# Patient Record
Sex: Female | Born: 1950
Health system: Southern US, Community
[De-identification: ages and names within clinical notes are randomized; demographics above are authoritative.]

## PROBLEM LIST (undated history)

## (undated) DIAGNOSIS — E7800 Pure hypercholesterolemia, unspecified: Secondary | ICD-10-CM

## (undated) DIAGNOSIS — E079 Disorder of thyroid, unspecified: Secondary | ICD-10-CM

## (undated) DIAGNOSIS — E119 Type 2 diabetes mellitus without complications: Secondary | ICD-10-CM

---

## 2013-08-20 ENCOUNTER — Encounter (HOSPITAL_BASED_OUTPATIENT_CLINIC_OR_DEPARTMENT_OTHER): Payer: Self-pay | Admitting: Emergency Medicine

## 2013-08-20 ENCOUNTER — Emergency Department (HOSPITAL_BASED_OUTPATIENT_CLINIC_OR_DEPARTMENT_OTHER): Payer: BC Managed Care – PPO

## 2013-08-20 ENCOUNTER — Emergency Department (HOSPITAL_BASED_OUTPATIENT_CLINIC_OR_DEPARTMENT_OTHER)
Admission: EM | Admit: 2013-08-20 | Discharge: 2013-08-20 | Disposition: A | Discharge status: Home / Self Care | Payer: BC Managed Care – PPO | Attending: Emergency Medicine | Admitting: Emergency Medicine

## 2013-08-20 DIAGNOSIS — E7800 Pure hypercholesterolemia, unspecified: Secondary | ICD-10-CM | POA: Insufficient documentation

## 2013-08-20 DIAGNOSIS — E079 Disorder of thyroid, unspecified: Secondary | ICD-10-CM | POA: Insufficient documentation

## 2013-08-20 DIAGNOSIS — E119 Type 2 diabetes mellitus without complications: Secondary | ICD-10-CM | POA: Insufficient documentation

## 2013-08-20 DIAGNOSIS — H538 Other visual disturbances: Secondary | ICD-10-CM | POA: Insufficient documentation

## 2013-08-20 DIAGNOSIS — Z79899 Other long term (current) drug therapy: Secondary | ICD-10-CM | POA: Insufficient documentation

## 2013-08-20 DIAGNOSIS — R0602 Shortness of breath: Secondary | ICD-10-CM | POA: Insufficient documentation

## 2013-08-20 DIAGNOSIS — R739 Hyperglycemia, unspecified: Secondary | ICD-10-CM

## 2013-08-20 HISTORY — DX: Pure hypercholesterolemia, unspecified: E78.00

## 2013-08-20 HISTORY — DX: Disorder of thyroid, unspecified: E07.9

## 2013-08-20 HISTORY — DX: Type 2 diabetes mellitus without complications: E11.9

## 2013-08-20 LAB — CBG MONITORING, ED: Glucose-Capillary: 380 mg/dL — ABNORMAL HIGH (ref 70–99)

## 2013-08-20 LAB — CBC WITH DIFFERENTIAL/PLATELET
Basophils Absolute: 0 10*3/uL (ref 0.0–0.1)
Basophils Relative: 0 % (ref 0–1)
Eosinophils Absolute: 0.1 10*3/uL (ref 0.0–0.7)
Eosinophils Relative: 1 % (ref 0–5)
HCT: 37.4 % (ref 36.0–46.0)
Hemoglobin: 12.5 g/dL (ref 12.0–15.0)
Lymphocytes Relative: 36 % (ref 12–46)
Lymphs Abs: 1.8 10*3/uL (ref 0.7–4.0)
MCH: 28.7 pg (ref 26.0–34.0)
MCHC: 33.4 g/dL (ref 30.0–36.0)
MCV: 85.8 fL (ref 78.0–100.0)
Monocytes Absolute: 0.4 10*3/uL (ref 0.1–1.0)
Monocytes Relative: 8 % (ref 3–12)
Neutro Abs: 2.7 10*3/uL (ref 1.7–7.7)
Neutrophils Relative %: 55 % (ref 43–77)
Platelets: 280 10*3/uL (ref 150–400)
RBC: 4.36 MIL/uL (ref 3.87–5.11)
RDW: 12.6 % (ref 11.5–15.5)
WBC: 4.9 10*3/uL (ref 4.0–10.5)

## 2013-08-20 LAB — I-STAT VENOUS BLOOD GAS, ED
Acid-Base Excess: 3 mmol/L — ABNORMAL HIGH (ref 0.0–2.0)
Bicarbonate: 29.8 mEq/L — ABNORMAL HIGH (ref 20.0–24.0)
O2 Saturation: 23 %
Patient temperature: 98
TCO2: 31 mmol/L (ref 0–100)
pCO2, Ven: 52.9 mmHg — ABNORMAL HIGH (ref 45.0–50.0)
pH, Ven: 7.356 — ABNORMAL HIGH (ref 7.250–7.300)
pO2, Ven: 17 mmHg — CL (ref 30.0–45.0)

## 2013-08-20 LAB — COMPREHENSIVE METABOLIC PANEL
ALT: 12 U/L (ref 0–35)
AST: 14 U/L (ref 0–37)
Albumin: 4.1 g/dL (ref 3.5–5.2)
Alkaline Phosphatase: 110 U/L (ref 39–117)
BUN: 9 mg/dL (ref 6–23)
CO2: 25 mEq/L (ref 19–32)
Calcium: 10.3 mg/dL (ref 8.4–10.5)
Chloride: 96 mEq/L (ref 96–112)
Creatinine, Ser: 0.7 mg/dL (ref 0.50–1.10)
GFR calc Af Amer: >90 mL/min (ref >90)
GFR calc non Af Amer: >90 mL/min (ref >90)
Glucose, Bld: 356 mg/dL — ABNORMAL HIGH (ref 70–99)
Potassium: 3.8 mEq/L (ref 3.7–5.3)
Sodium: 138 mEq/L (ref 137–147)
Total Bilirubin: 0.3 mg/dL (ref 0.3–1.2)
Total Protein: 7.8 g/dL (ref 6.0–8.3)

## 2013-08-20 LAB — URINALYSIS, ROUTINE W REFLEX MICROSCOPIC
Bilirubin Urine: NEGATIVE
Glucose, UA: >1000 mg/dL — AB
Ketones, ur: 15 mg/dL — AB
Leukocytes, UA: NEGATIVE
Nitrite: NEGATIVE
Protein, ur: NEGATIVE mg/dL
Specific Gravity, Urine: 1.044 — ABNORMAL HIGH (ref 1.005–1.030)
Urobilinogen, UA: 0.2 mg/dL (ref 0.0–1.0)
pH: 5.5 (ref 5.0–8.0)

## 2013-08-20 LAB — KETONES, QUALITATIVE: Acetone, Bld: NEGATIVE

## 2013-08-20 LAB — BASIC METABOLIC PANEL
BUN: 8 mg/dL (ref 6–23)
CO2: 26 mEq/L (ref 19–32)
Calcium: 9.2 mg/dL (ref 8.4–10.5)
Chloride: 100 mEq/L (ref 96–112)
Creatinine, Ser: 0.7 mg/dL (ref 0.50–1.10)
GFR calc Af Amer: >90 mL/min (ref >90)
GFR calc non Af Amer: >90 mL/min (ref >90)
Glucose, Bld: 295 mg/dL — ABNORMAL HIGH (ref 70–99)
Potassium: 3.7 mEq/L (ref 3.7–5.3)
Sodium: 139 mEq/L (ref 137–147)

## 2013-08-20 LAB — URINE MICROSCOPIC-ADD ON

## 2013-08-20 LAB — TROPONIN I: Troponin I: <0.3 ng/mL (ref <0.30)

## 2013-08-20 LAB — I-STAT CG4 LACTIC ACID, ED: Lactic Acid, Venous: 2.1 mmol/L (ref 0.5–2.2)

## 2013-08-20 MED ORDER — SODIUM CHLORIDE 0.9 % IV BOLUS (SEPSIS)
1000.0000 mL | Freq: Once | INTRAVENOUS | Status: AC
  Administered 2013-08-20: 1000 mL via INTRAVENOUS

## 2013-08-20 MED ORDER — INSULIN ASPART 100 UNIT/ML ~~LOC~~ SOLN
5.0000 [IU] | Freq: Once | SUBCUTANEOUS | Status: AC
  Administered 2013-08-20: 5 [IU] via SUBCUTANEOUS
  Filled 2013-08-20: qty 1

## 2013-08-20 MED ORDER — METFORMIN HCL 500 MG PO TABS: 500.0000 mg | ORAL_TABLET | Freq: Two times a day (BID) | ORAL | Status: DC

## 2013-08-20 NOTE — ED Provider Notes (Signed)
CSN: [id number]     Arrival date & time 08/20/13  1738 History  This chart was scribed for Ezequiel Essex, MD by Rolanda Lundborg, ED Scribe. This patient was seen in room MH02/MH02 and the patient's care was started at 7:02 PM.    Chief Complaint  Patient presents with  . Hyperglycemia   The history is provided by the patient. No language interpreter was used.   HPI Comments: Margaret Long is a 63 y.o. female who presents to the Emergency Department complaining of hyperglycemia and increasingly blurry vision. Pt reports her home sugars have been running over 300 for the past 2 weeks. She reports one reading last week was 593. She states before 2 weeks ago her sugars were running below 200. She states she called her PCP today and was told to come here. She stopped taking her insulin and metformin one year ago after losing weight and her DM went away. She denies h/o heart problems and lung problems. She reports intermittent throbbing pain in her left breast that last a few seconds and goes away. She reports she has been getting SOB when walking. She denies CP when walking around. She has been eating and drinking well. She denies nausea, vomiting, diarrhea, fever, cough, hematuria, dysuria, back pain, dizziness, or lightheadedness.    Past Medical History  Diagnosis Date  . Diabetes mellitus without complication   . Thyroid disease   . High cholesterol    History reviewed. No pertinent past surgical history. No family history on file. History  Substance Use Topics  . Smoking status: Never Smoker   . Smokeless tobacco: Not on file  . Alcohol Use: No   OB History   Grav Para Term Preterm Abortions TAB SAB Ect Mult Living                 Review of Systems  Constitutional: Negative for fever.  Eyes: Positive for visual disturbance.  Respiratory: Negative for cough.   Gastrointestinal: Negative for nausea, vomiting, abdominal pain and diarrhea.  Genitourinary: Negative for dysuria and  hematuria.  Musculoskeletal: Negative for back pain.  Neurological: Negative for dizziness and light-headedness.   A complete 10 system review of systems was obtained and all systems are negative except as noted in the HPI and PMH.     Allergies  Review of patient's allergies indicates no known allergies.  Home Medications   Current Outpatient Rx  Name  Route  Sig  Dispense  Refill  . Levothyroxine Sodium (SYNTHROID PO)   Oral   Take by mouth.         . pravastatin (PRAVACHOL) 20 MG tablet   Oral   Take 20 mg by mouth daily.         . metFORMIN (GLUCOPHAGE) 500 MG tablet   Oral   Take 1 tablet (500 mg total) by mouth 2 (two) times daily with a meal.   60 tablet   0    BP 183/91  Pulse 86  Temp(Src) 98 F (36.7 C) (Oral)  Resp 20  Ht 5' 6"$  (1.676 m)  Wt 155 lb (70.308 kg)  BMI 25.03 kg/m2  SpO2 99% Physical Exam  Nursing note and vitals reviewed. Constitutional: She is oriented to person, place, and time. She appears well-developed and well-nourished. No distress.  HENT:  Head: Normocephalic and atraumatic.  Mouth/Throat: Oropharynx is clear and moist. No oropharyngeal exudate.  Eyes: Conjunctivae and EOM are normal. Pupils are equal, round, and reactive to light.  Neck: Normal  range of motion. Neck supple. No tracheal deviation present.  Cardiovascular: Normal rate.   Pulmonary/Chest: Effort normal. No respiratory distress.  Abdominal: Soft. There is no tenderness. There is no rebound and no guarding.  Musculoskeletal: Normal range of motion. She exhibits no edema and no tenderness.  Neurological: She is alert and oriented to person, place, and time. No cranial nerve deficit. She exhibits normal muscle tone. Coordination normal.  Skin: Skin is warm and dry.  Psychiatric: She has a normal mood and affect. Her behavior is normal.    ED Course  Procedures (including critical care time) Medications  sodium chloride 0.9 % bolus 1,000 mL (0 mLs Intravenous  Stopped 08/20/13 2120)  insulin aspart (novoLOG) injection 5 Units (5 Units Subcutaneous Given 08/20/13 2035)  sodium chloride 0.9 % bolus 1,000 mL (0 mLs Intravenous Stopped 08/20/13 2236)    DIAGNOSTIC STUDIES: Oxygen Saturation is 99% on RA, normal by my interpretation.    COORDINATION OF CARE: 7:19 PM- Discussed treatment plan with pt. Pt agrees to plan.    Labs Review Labs Reviewed  URINALYSIS, ROUTINE W REFLEX MICROSCOPIC - Abnormal; Notable for the following:    Specific Gravity, Urine 1.044 (*)    Glucose, UA >1000 (*)    Hgb urine dipstick TRACE (*)    Ketones, ur 15 (*)    All other components within normal limits  URINE MICROSCOPIC-ADD ON - Abnormal; Notable for the following:    Squamous Epithelial / LPF FEW (*)    Bacteria, UA FEW (*)    All other components within normal limits  COMPREHENSIVE METABOLIC PANEL - Abnormal; Notable for the following:    Glucose, Bld 356 (*)    All other components within normal limits  BASIC METABOLIC PANEL - Abnormal; Notable for the following:    Glucose, Bld 295 (*)    All other components within normal limits  CBG MONITORING, ED - Abnormal; Notable for the following:    Glucose-Capillary 380 (*)    All other components within normal limits  I-STAT VENOUS BLOOD GAS, ED - Abnormal; Notable for the following:    pH, Ven 7.356 (*)    pCO2, Ven 52.9 (*)    pO2, Ven 17.0 (*)    Bicarbonate 29.8 (*)    Acid-Base Excess 3.0 (*)    All other components within normal limits  CBC WITH DIFFERENTIAL  KETONES, QUALITATIVE  TROPONIN I  I-STAT CG4 LACTIC ACID, ED   Imaging Review Dg Chest 2 View  08/20/2013   CLINICAL DATA:  Weakness and shortness of breath; hyperglycemia  EXAM: CHEST  2 VIEW  COMPARISON:  None.  FINDINGS: Lungs are clear. Heart size and pulmonary vascularity are normal. No adenopathy. No bone lesions.  IMPRESSION: No abnormality noted.   Electronically Signed   By: Lowella Grip M.D.   On: 08/20/2013 20:16     EKG  Interpretation   Date/Time:  Monday August 20 2013 17:56:34 EDT Ventricular Rate:  79 PR Interval:  158 QRS Duration: 82 QT Interval:  392 QTC Calculation: 449 R Axis:   45 Text Interpretation:  Normal sinus rhythm Normal ECG No previous ECGs  available Confirmed by Mataeo Ingwersen  MD, Talma Aguillard ([id number]) on 08/20/2013 6:05:21  PM      MDM   Final diagnoses:  Hyperglycemia   Elevated blood sugar for the past 2 weeks associated blurry vision. No longer on metformin but has been in the past. Referred to ED by Dr. Today.  Patient is nontoxic-appearing. Vital stable.  Blood sugar 356. Anion gap 17. Serum ketones negative. Small ketones in urine.  patient given aggressive IV hydration and subcutaneous insulin.  Blood sugar improved to 295. Anion gap 13. No evidence of DKA. Patient advised to restart metformin and followup with PCP this week. Advised to keep log of blood sugars. Metformin restarted at 500 mg twice daily. Patient instructed to keep blood sugar log and followup with PCP this week.  I personally performed the services described in this documentation, which was scribed in my presence. The recorded information has been reviewed and is accurate.   Ezequiel Essex, MD 08/21/13 [phone]

## 2013-08-20 NOTE — Discharge Instructions (Signed)
Hyperglycemia Followup with your Dr. this week. Keep a log of your blood sugars. Return to the ED if you develop new or worsening symptoms. Hyperglycemia occurs when the glucose (sugar) in your blood is too high. Hyperglycemia can happen for many reasons, but it most often happens to people who do not know they have diabetes or are not managing their diabetes properly.  CAUSES  Whether you have diabetes or not, there are other causes of hyperglycemia. Hyperglycemia can occur when you have diabetes, but it can also occur in other situations that you might not be as aware of, such as: Diabetes  If you have diabetes and are having problems controlling your blood glucose, hyperglycemia could occur because of some of the following reasons:  Not following your meal plan.  Not taking your diabetes medications or not taking it properly.  Exercising less or doing less activity than you normally do.  Being sick. Pre-diabetes  This cannot be ignored. Before people develop Type 2 diabetes, they almost always have "pre-diabetes." This is when your blood glucose levels are higher than normal, but not yet high enough to be diagnosed as diabetes. Research has shown that some long-term damage to the body, especially the heart and circulatory system, may already be occurring during pre-diabetes. If you take action to manage your blood glucose when you have pre-diabetes, you may delay or prevent Type 2 diabetes from developing. Stress  If you have diabetes, you may be "diet" controlled or on oral medications or insulin to control your diabetes. However, you may find that your blood glucose is higher than usual in the hospital whether you have diabetes or not. This is often referred to as "stress hyperglycemia." Stress can elevate your blood glucose. This happens because of hormones put out by the body during times of stress. If stress has been the cause of your high blood glucose, it can be followed regularly by  your caregiver. That way he/she can make sure your hyperglycemia does not continue to get worse or progress to diabetes. Steroids  Steroids are medications that act on the infection fighting system (immune system) to block inflammation or infection. One side effect can be a rise in blood glucose. Most people can produce enough extra insulin to allow for this rise, but for those who cannot, steroids make blood glucose levels go even higher. It is not unusual for steroid treatments to "uncover" diabetes that is developing. It is not always possible to determine if the hyperglycemia will go away after the steroids are stopped. A special blood test called an A1c is sometimes done to determine if your blood glucose was elevated before the steroids were started. SYMPTOMS  Thirsty.  Frequent urination.  Dry mouth.  Blurred vision.  Tired or fatigue.  Weakness.  Sleepy.  Tingling in feet or leg. DIAGNOSIS  Diagnosis is made by monitoring blood glucose in one or all of the following ways:  A1c test. This is a chemical found in your blood.  Fingerstick blood glucose monitoring.  Laboratory results. TREATMENT  First, knowing the cause of the hyperglycemia is important before the hyperglycemia can be treated. Treatment may include, but is not be limited to:  Education.  Change or adjustment in medications.  Change or adjustment in meal plan.  Treatment for an illness, infection, etc.  More frequent blood glucose monitoring.  Change in exercise plan.  Decreasing or stopping steroids.  Lifestyle changes. HOME CARE INSTRUCTIONS   Test your blood glucose as directed.  Exercise  regularly. Your caregiver will give you instructions about exercise. Pre-diabetes or diabetes which comes on with stress is helped by exercising.  Eat wholesome, balanced meals. Eat often and at regular, fixed times. Your caregiver or nutritionist will give you a meal plan to guide your sugar  intake.  Being at an ideal weight is important. If needed, losing as little as 10 to 15 pounds may help improve blood glucose levels. SEEK MEDICAL CARE IF:   You have questions about medicine, activity, or diet.  You continue to have symptoms (problems such as increased thirst, urination, or weight gain). SEEK IMMEDIATE MEDICAL CARE IF:   You are vomiting or have diarrhea.  Your breath smells fruity.  You are breathing faster or slower.  You are very sleepy or incoherent.  You have numbness, tingling, or pain in your feet or hands.  You have chest pain.  Your symptoms get worse even though you have been following your caregiver's orders.  If you have any other questions or concerns. Document Released: 11/03/2000 Document Revised: 08/02/2011 Document Reviewed: 09/06/2011 [facility] Patient Information 2014 [place], [place].

## 2013-08-20 NOTE — ED Notes (Signed)
Pt reports that she has had elevated sugars over the past 2 weeks.  Pt reports increased stress and eating fast food more.  Denies N/V/D.  Reports that she was taken off metformin recently because her diabetes was under control.  Also reports intermittent blurred vision.  Pt had an eye exam last month and was normal.

## 2013-08-20 NOTE — ED Notes (Signed)
High blood sugar. accu checks at home have been around 300 for the past 2 weeks. Sob, blurred vision.

## 2014-07-29 DIAGNOSIS — E119 Type 2 diabetes mellitus without complications: Secondary | ICD-10-CM

## 2014-07-29 DIAGNOSIS — E7800 Pure hypercholesterolemia, unspecified: Secondary | ICD-10-CM | POA: Insufficient documentation

## 2014-07-29 DIAGNOSIS — E890 Postprocedural hypothyroidism: Secondary | ICD-10-CM | POA: Insufficient documentation

## 2014-07-29 HISTORY — DX: Postprocedural hypothyroidism: E89.0

## 2014-07-29 HISTORY — DX: Pure hypercholesterolemia, unspecified: E78.00

## 2014-07-29 HISTORY — DX: Type 2 diabetes mellitus without complications: E11.9

## 2014-07-30 DIAGNOSIS — G4700 Insomnia, unspecified: Secondary | ICD-10-CM | POA: Insufficient documentation

## 2014-07-30 DIAGNOSIS — F329 Major depressive disorder, single episode, unspecified: Secondary | ICD-10-CM

## 2014-07-30 DIAGNOSIS — E559 Vitamin D deficiency, unspecified: Secondary | ICD-10-CM | POA: Insufficient documentation

## 2014-07-30 HISTORY — DX: Vitamin D deficiency, unspecified: E55.9

## 2014-07-30 HISTORY — DX: Major depressive disorder, single episode, unspecified: F32.9

## 2014-07-30 HISTORY — DX: Insomnia, unspecified: G47.00

## 2015-06-10 DIAGNOSIS — I10 Essential (primary) hypertension: Secondary | ICD-10-CM

## 2015-06-10 HISTORY — DX: Essential (primary) hypertension: I10

## 2015-07-12 ENCOUNTER — Emergency Department (HOSPITAL_BASED_OUTPATIENT_CLINIC_OR_DEPARTMENT_OTHER)
Admission: EM | Admit: 2015-07-12 | Discharge: 2015-07-12 | Disposition: A | Discharge status: Home / Self Care | Payer: BLUE CROSS/BLUE SHIELD | Attending: Emergency Medicine | Admitting: Emergency Medicine

## 2015-07-12 DIAGNOSIS — Z7984 Long term (current) use of oral hypoglycemic drugs: Secondary | ICD-10-CM | POA: Diagnosis not present

## 2015-07-12 DIAGNOSIS — M542 Cervicalgia: Secondary | ICD-10-CM | POA: Diagnosis not present

## 2015-07-12 DIAGNOSIS — E7800 Pure hypercholesterolemia, unspecified: Secondary | ICD-10-CM | POA: Insufficient documentation

## 2015-07-12 DIAGNOSIS — J312 Chronic pharyngitis: Secondary | ICD-10-CM

## 2015-07-12 DIAGNOSIS — Z79899 Other long term (current) drug therapy: Secondary | ICD-10-CM | POA: Insufficient documentation

## 2015-07-12 DIAGNOSIS — E079 Disorder of thyroid, unspecified: Secondary | ICD-10-CM | POA: Diagnosis not present

## 2015-07-12 DIAGNOSIS — H538 Other visual disturbances: Secondary | ICD-10-CM | POA: Diagnosis not present

## 2015-07-12 DIAGNOSIS — E119 Type 2 diabetes mellitus without complications: Secondary | ICD-10-CM | POA: Insufficient documentation

## 2015-07-12 DIAGNOSIS — J029 Acute pharyngitis, unspecified: Secondary | ICD-10-CM | POA: Diagnosis not present

## 2015-07-12 LAB — RAPID STREP SCREEN (MED CTR MEBANE ONLY): Streptococcus, Group A Screen (Direct): NEGATIVE

## 2015-07-12 NOTE — ED Provider Notes (Signed)
CSN: [id number]     Arrival date & time 07/12/15  0708 History   First MD Initiated Contact with Patient 07/12/15 0719     Chief Complaint  Patient presents with  . Sore Throat     (Consider location/radiation/quality/duration/timing/severity/associated sxs/prior Treatment) Patient is a 65 y.o. female presenting with pharyngitis. The history is provided by the patient.  Sore Throat Pertinent negatives include no chest pain, no abdominal pain, no headaches and no shortness of breath.   Patient with complaint of sore throat since December. Evaluate by her primary care doctor at that time with an adjustment in one of her medications without any improvement. Patient states has been constant since that time increasing some of late associated with mild difficulty with swallowing. No trouble breathing. Burning sensation. No known history of reflux currently but she's had it in the past. Patient does have diabetes. As well as patient does have a history of thyroid disease. Patient does not feel like she has an acute upper respiratory infection. No fevers. No change in voice. Past Medical History  Diagnosis Date  . Diabetes mellitus without complication   . Thyroid disease   . High cholesterol    No past surgical history on file. No family history on file. Social History  Substance Use Topics  . Smoking status: Never Smoker   . Smokeless tobacco: Not on file  . Alcohol Use: No   OB History    No data available     Review of Systems  Constitutional: Negative for fever.  HENT: Positive for sore throat and trouble swallowing. Negative for congestion.   Eyes: Positive for visual disturbance. Negative for pain.  Respiratory: Negative for shortness of breath.   Cardiovascular: Negative for chest pain.  Gastrointestinal: Negative for nausea, vomiting and abdominal pain.  Genitourinary: Negative for dysuria.  Musculoskeletal: Positive for neck pain. Negative for neck stiffness.  Skin:  Negative for rash.  Neurological: Negative for speech difficulty and headaches.  Hematological: Does not bruise/bleed easily.  Psychiatric/Behavioral: Negative for confusion.      Allergies  Review of patient's allergies indicates no known allergies.  Home Medications   Prior to Admission medications   Medication Sig Start Date End Date Taking? Authorizing Provider  Levothyroxine Sodium (SYNTHROID PO) Take by mouth.   Yes Historical Provider, MD  metFORMIN (GLUCOPHAGE) 500 MG tablet Take 1 tablet (500 mg total) by mouth 2 (two) times daily with a meal. 08/20/13  Yes Ezequiel Essex, MD  pravastatin (PRAVACHOL) 20 MG tablet Take 20 mg by mouth daily.    Historical Provider, MD   BP 164/98 mmHg  Pulse 86  Temp(Src) 98.1 F (36.7 C) (Oral)  Resp 18  Ht 5\' 6"  (1.676 m)  Wt 68.493 kg  BMI 24.38 kg/m2  SpO2 99% Physical Exam  Constitutional: She is oriented to person, place, and time. She appears well-developed and well-nourished. No distress.  HENT:  Head: Normocephalic and atraumatic.  Mouth/Throat: Oropharynx is clear and moist. No oropharyngeal exudate.  Eyes: Conjunctivae and EOM are normal. Pupils are equal, round, and reactive to light.  Neck: Normal range of motion. Neck supple.  Cardiovascular: Normal rate, regular rhythm and normal heart sounds.   Pulmonary/Chest: Effort normal and breath sounds normal. No respiratory distress.  Abdominal: Soft. Bowel sounds are normal. She exhibits no distension.  Musculoskeletal: Normal range of motion.  Neurological: She is alert and oriented to person, place, and time. No cranial nerve deficit. She exhibits normal muscle tone. Coordination normal.  Skin:  Skin is warm. No rash noted.  Nursing note and vitals reviewed.   ED Course  Procedures (including critical care time) Labs Review Labs Reviewed  RAPID STREP SCREEN (NOT AT [facility])  CULTURE, GROUP A STREP [facility])    Imaging Review No results found. I have personally reviewed  and evaluated these images and lab results as part of my medical decision-making.   EKG Interpretation None      MDM   Final diagnoses:  Sore throat, chronic   Patient with complaint of sore throat the constantly since December. Patient states that it's seems to be in the lower part of the throat not the back of throat. Patient's voice is not hoarse. Examination of the thyroid area seems to be normal. The pharynx appeared normal. Recommend follow-up with ear nose and throat for fiber-optic evaluation. Patient's rapid strep was negative as expected. Throat culture is pending.    Fredia Sorrow, MD 07/12/15 [phone]

## 2015-07-12 NOTE — ED Notes (Signed)
States has had a sore throat, progressively worse since Dec, this week discomfort is more

## 2015-07-12 NOTE — Discharge Instructions (Signed)
Rapid strep was negative. Formal culture pending. As we discussed we expected it to be negative. Important that you follow up with ear nose and throat for additional evaluation.

## 2015-07-12 NOTE — ED Notes (Signed)
States has had a sore throat since Dec, has progressively become worse since then, now having some difficulty swallowing. Speech WNL. Pain at times feels like it is burning

## 2015-07-15 LAB — CULTURE, GROUP A STREP (THRC)

## 2015-08-25 DIAGNOSIS — J309 Allergic rhinitis, unspecified: Secondary | ICD-10-CM | POA: Insufficient documentation

## 2015-08-25 DIAGNOSIS — M8580 Other specified disorders of bone density and structure, unspecified site: Secondary | ICD-10-CM

## 2015-08-25 DIAGNOSIS — K219 Gastro-esophageal reflux disease without esophagitis: Secondary | ICD-10-CM | POA: Insufficient documentation

## 2015-08-25 DIAGNOSIS — G5600 Carpal tunnel syndrome, unspecified upper limb: Secondary | ICD-10-CM

## 2015-08-25 DIAGNOSIS — M810 Age-related osteoporosis without current pathological fracture: Secondary | ICD-10-CM

## 2015-08-25 DIAGNOSIS — E785 Hyperlipidemia, unspecified: Secondary | ICD-10-CM | POA: Insufficient documentation

## 2015-08-25 HISTORY — DX: Allergic rhinitis, unspecified: J30.9

## 2015-08-25 HISTORY — DX: Carpal tunnel syndrome, unspecified upper limb: G56.00

## 2015-08-25 HISTORY — DX: Hyperlipidemia, unspecified: E78.5

## 2015-08-25 HISTORY — DX: Other specified disorders of bone density and structure, unspecified site: M85.80

## 2015-08-25 HISTORY — DX: Gastro-esophageal reflux disease without esophagitis: K21.9

## 2015-08-25 HISTORY — DX: Age-related osteoporosis without current pathological fracture: M81.0

## 2017-03-02 DIAGNOSIS — E559 Vitamin D deficiency, unspecified: Secondary | ICD-10-CM | POA: Diagnosis not present

## 2017-03-17 DIAGNOSIS — H524 Presbyopia: Secondary | ICD-10-CM | POA: Diagnosis not present

## 2017-04-28 ENCOUNTER — Other Ambulatory Visit: Payer: Self-pay | Admitting: *Deleted

## 2017-04-28 NOTE — Patient Outreach (Signed)
[facility]) Care Management  04/28/2017  Margaret Long [birth year] [id number]   Health Risk Assessment call  Successful outreach call to patient , explained purpose of call. Patient discussed her medical conditions of  Diabetes : monitoring blood sugars daily, taking metformin as prescribed,recent A1c less than 78, patient discussed regular follow up visits to with PCP. Hypertension : taking medications as prescribed,  Reports blood pressure readings have been controlled at office visits, she has  plans to purchase monitor to begin checking at home, she reports being a nonsmoker.   Patient denies question,concerns or follow up needs. Patient verifies her PCP is  [employer]NP in [facility].    Will send follow-up letter with [facility] care management contact information.   Joylene Draft, RN, [place] Management Coordinator  [phone]- Mobile [phone]- Toll Free Main Office

## 2017-04-29 ENCOUNTER — Encounter: Payer: Self-pay | Admitting: *Deleted

## 2017-05-27 DIAGNOSIS — E119 Type 2 diabetes mellitus without complications: Secondary | ICD-10-CM | POA: Diagnosis not present

## 2017-05-27 DIAGNOSIS — Z1211 Encounter for screening for malignant neoplasm of colon: Secondary | ICD-10-CM | POA: Diagnosis not present

## 2017-05-27 DIAGNOSIS — E559 Vitamin D deficiency, unspecified: Secondary | ICD-10-CM | POA: Diagnosis not present

## 2017-05-27 DIAGNOSIS — E890 Postprocedural hypothyroidism: Secondary | ICD-10-CM | POA: Diagnosis not present

## 2017-05-27 DIAGNOSIS — I10 Essential (primary) hypertension: Secondary | ICD-10-CM | POA: Diagnosis not present

## 2017-05-27 DIAGNOSIS — E782 Mixed hyperlipidemia: Secondary | ICD-10-CM | POA: Diagnosis not present

## 2017-05-31 DIAGNOSIS — F411 Generalized anxiety disorder: Secondary | ICD-10-CM | POA: Diagnosis not present

## 2017-05-31 DIAGNOSIS — R0602 Shortness of breath: Secondary | ICD-10-CM | POA: Diagnosis not present

## 2017-06-03 DIAGNOSIS — Z780 Asymptomatic menopausal state: Secondary | ICD-10-CM | POA: Diagnosis not present

## 2017-06-03 DIAGNOSIS — M8580 Other specified disorders of bone density and structure, unspecified site: Secondary | ICD-10-CM | POA: Diagnosis not present

## 2017-06-03 DIAGNOSIS — M8588 Other specified disorders of bone density and structure, other site: Secondary | ICD-10-CM | POA: Diagnosis not present

## 2017-06-03 DIAGNOSIS — Z1231 Encounter for screening mammogram for malignant neoplasm of breast: Secondary | ICD-10-CM | POA: Diagnosis not present

## 2017-06-22 DIAGNOSIS — F411 Generalized anxiety disorder: Secondary | ICD-10-CM | POA: Diagnosis not present

## 2017-06-22 DIAGNOSIS — Z01419 Encounter for gynecological examination (general) (routine) without abnormal findings: Secondary | ICD-10-CM | POA: Diagnosis not present

## 2017-06-22 DIAGNOSIS — Z0000 Encounter for general adult medical examination without abnormal findings: Secondary | ICD-10-CM | POA: Diagnosis not present

## 2017-07-25 DIAGNOSIS — D485 Neoplasm of uncertain behavior of skin: Secondary | ICD-10-CM | POA: Diagnosis not present

## 2017-07-25 DIAGNOSIS — L821 Other seborrheic keratosis: Secondary | ICD-10-CM | POA: Diagnosis not present

## 2017-07-25 DIAGNOSIS — D2272 Melanocytic nevi of left lower limb, including hip: Secondary | ICD-10-CM | POA: Diagnosis not present

## 2017-08-19 DIAGNOSIS — E538 Deficiency of other specified B group vitamins: Secondary | ICD-10-CM

## 2017-08-19 DIAGNOSIS — E559 Vitamin D deficiency, unspecified: Secondary | ICD-10-CM | POA: Diagnosis not present

## 2017-08-19 DIAGNOSIS — I10 Essential (primary) hypertension: Secondary | ICD-10-CM | POA: Diagnosis not present

## 2017-08-19 HISTORY — DX: Deficiency of other specified B group vitamins: E53.8

## 2017-09-12 DIAGNOSIS — D2272 Melanocytic nevi of left lower limb, including hip: Secondary | ICD-10-CM | POA: Diagnosis not present

## 2017-09-12 DIAGNOSIS — M71342 Other bursal cyst, left hand: Secondary | ICD-10-CM | POA: Diagnosis not present

## 2017-12-23 DIAGNOSIS — E782 Mixed hyperlipidemia: Secondary | ICD-10-CM | POA: Diagnosis not present

## 2017-12-23 DIAGNOSIS — I10 Essential (primary) hypertension: Secondary | ICD-10-CM | POA: Diagnosis not present

## 2017-12-23 DIAGNOSIS — E559 Vitamin D deficiency, unspecified: Secondary | ICD-10-CM | POA: Diagnosis not present

## 2018-03-22 DIAGNOSIS — E119 Type 2 diabetes mellitus without complications: Secondary | ICD-10-CM | POA: Diagnosis not present

## 2018-03-22 DIAGNOSIS — H2513 Age-related nuclear cataract, bilateral: Secondary | ICD-10-CM | POA: Diagnosis not present

## 2018-03-22 DIAGNOSIS — H43393 Other vitreous opacities, bilateral: Secondary | ICD-10-CM | POA: Diagnosis not present

## 2018-03-22 DIAGNOSIS — H04123 Dry eye syndrome of bilateral lacrimal glands: Secondary | ICD-10-CM | POA: Diagnosis not present

## 2018-06-13 DIAGNOSIS — E782 Mixed hyperlipidemia: Secondary | ICD-10-CM | POA: Diagnosis not present

## 2018-06-13 DIAGNOSIS — Z1239 Encounter for other screening for malignant neoplasm of breast: Secondary | ICD-10-CM | POA: Diagnosis not present

## 2018-06-13 DIAGNOSIS — Z7984 Long term (current) use of oral hypoglycemic drugs: Secondary | ICD-10-CM | POA: Diagnosis not present

## 2018-06-13 DIAGNOSIS — M25551 Pain in right hip: Secondary | ICD-10-CM | POA: Diagnosis not present

## 2018-06-13 DIAGNOSIS — G4700 Insomnia, unspecified: Secondary | ICD-10-CM | POA: Diagnosis not present

## 2018-06-13 DIAGNOSIS — E890 Postprocedural hypothyroidism: Secondary | ICD-10-CM | POA: Diagnosis not present

## 2018-06-13 DIAGNOSIS — I10 Essential (primary) hypertension: Secondary | ICD-10-CM | POA: Diagnosis not present

## 2018-06-13 DIAGNOSIS — E559 Vitamin D deficiency, unspecified: Secondary | ICD-10-CM | POA: Diagnosis not present

## 2018-06-13 DIAGNOSIS — Z87891 Personal history of nicotine dependence: Secondary | ICD-10-CM | POA: Diagnosis not present

## 2018-06-13 DIAGNOSIS — E119 Type 2 diabetes mellitus without complications: Secondary | ICD-10-CM | POA: Diagnosis not present

## 2018-06-19 DIAGNOSIS — M7061 Trochanteric bursitis, right hip: Secondary | ICD-10-CM

## 2018-06-19 DIAGNOSIS — M25551 Pain in right hip: Secondary | ICD-10-CM | POA: Diagnosis not present

## 2018-06-19 HISTORY — DX: Trochanteric bursitis, right hip: M70.61

## 2018-06-23 DIAGNOSIS — Z0000 Encounter for general adult medical examination without abnormal findings: Secondary | ICD-10-CM | POA: Diagnosis not present

## 2018-06-23 DIAGNOSIS — Z1239 Encounter for other screening for malignant neoplasm of breast: Secondary | ICD-10-CM | POA: Diagnosis not present

## 2018-06-23 DIAGNOSIS — Z1231 Encounter for screening mammogram for malignant neoplasm of breast: Secondary | ICD-10-CM | POA: Diagnosis not present

## 2018-06-23 DIAGNOSIS — Z2821 Immunization not carried out because of patient refusal: Secondary | ICD-10-CM | POA: Diagnosis not present

## 2018-07-21 ENCOUNTER — Other Ambulatory Visit: Payer: Self-pay

## 2018-07-21 NOTE — Patient Outreach (Signed)
  [facility]) Care Management Chronic Special Needs Program  07/21/2018  Name: Margaret Long DOB: 05-16-1951  MRN: [id number]  Ms. Margaret Long is enrolled in a chronic special needs plan for  Diabetes. A completed health risk assessment has not been received from the client and client has not responded to outreach attempts by their health care concierge.  The client's individualized care plan was developed based on available data.  Plan:  . Send unsuccessful outreach letter with a copy of individualized care plan to client . Send individualized care plan to provider . Send Advanced Directives packet, HTN education  Chronic care management coordinator will attempt outreach in 2-4 months.    Peter Garter RN, Jackquline Denmark, CDE Chronic Care Management Coordinator [facility] Care Management [phone]

## 2018-10-19 ENCOUNTER — Other Ambulatory Visit: Payer: Self-pay

## 2018-10-19 NOTE — Patient Outreach (Signed)
[facility]) Care Management Chronic Special Needs Program  10/19/2018  Name: Margaret Long DOB: 1951/03/25  MRN: [id number]  Margaret Long is enrolled in a chronic special needs plan for Diabetes. Chronic Care Management Coordinator telephoned client to review health risk assessment and to develop individualized care plan.  Introduced the chronic care management program, importance of client participation, and taking their care plan to all provider appointments and inpatient facilities.  Reviewed the transition of care process and possible referral to community care management.  Subjective: Client states that her blood sugars range from 90-130 in the morning.  States she was walking before the covid shut down but she has not restarted.  States she has the Advanced Directives forms but does not wish to get assistance to complete them.  States her B/P usually runs good.    Goals Addressed            This Visit's Progress   . COMPLETED:  Acknowledge receipt of Advanced Directive package       Received packet    . Advanced Care Planning complete by 9 months   On track   . Client understands the importance of follow-up with providers by attending scheduled visits   On track   . Client will use Assistive Devices as needed and verbalize understanding of device use   On track   . Client will verbalize knowledge of self management of Hypertension as evidences by BP reading of 140/90 or less; or as defined by provider   On track   . HEMOGLOBIN A1C < 7.0       Diabetes self management actions:  Glucose monitoring per provider recommendations  Eat Healthy  Check feet daily  Visit provider every 3-6 months as directed  Hbg A1C level every 3-6 months.  Eye Exam yearly    . Maintain timely refills of diabetic medication as prescribed within the year .   On track   . COMPLETED: Obtain annual  Lipid Profile, LDL-C       Completed 06/13/18     . Obtain Annual Eye  (retinal)  Exam    On track   . COMPLETED: Obtain Annual Foot Exam       Completed 06/13/18    . Obtain annual screen for micro albuminuria (urine) , nephropathy (kidney problems)   On track         . Obtain Hemoglobin A1C at least 2 times per year   On track    Completed 06/13/18     . Visit Primary Care Provider or Endocrinologist at least 2 times per year    On track    Saw provider 06/23/18      Client is meeting diabetes self management goal of hemoglobin A1C of <7% with last reading 6.4% on 06/13/18.  Client received Advanced Directives packet but declines social work referral at this time. Encouraged to follow a low CHO diet and to limit drinking sugar sweeten beverages.  Encouraged to walk outside but does not want to make exercise goal at this time. Discussed COVID19 cause, symptoms, precautions (social distancing, stay at home order, hand washing), confirmed client knows how to contact provider Plan:  Send successful outreach letter with a copy of their individualized care plan, Send individual care plan to provider and Send educational material  Chronic care management coordination will outreach in:  6 Months     Peter Garter RN, [employer], [place] Management [phone])  [phone]  

## 2018-11-03 DIAGNOSIS — Z20828 Contact with and (suspected) exposure to other viral communicable diseases: Secondary | ICD-10-CM | POA: Diagnosis not present

## 2018-12-22 DIAGNOSIS — E119 Type 2 diabetes mellitus without complications: Secondary | ICD-10-CM | POA: Diagnosis not present

## 2018-12-22 DIAGNOSIS — Z794 Long term (current) use of insulin: Secondary | ICD-10-CM | POA: Diagnosis not present

## 2018-12-22 DIAGNOSIS — I10 Essential (primary) hypertension: Secondary | ICD-10-CM | POA: Diagnosis not present

## 2018-12-22 DIAGNOSIS — E559 Vitamin D deficiency, unspecified: Secondary | ICD-10-CM | POA: Diagnosis not present

## 2018-12-22 DIAGNOSIS — E890 Postprocedural hypothyroidism: Secondary | ICD-10-CM | POA: Diagnosis not present

## 2018-12-22 DIAGNOSIS — K219 Gastro-esophageal reflux disease without esophagitis: Secondary | ICD-10-CM | POA: Diagnosis not present

## 2019-01-24 DIAGNOSIS — N6459 Other signs and symptoms in breast: Secondary | ICD-10-CM | POA: Diagnosis not present

## 2019-01-24 DIAGNOSIS — K219 Gastro-esophageal reflux disease without esophagitis: Secondary | ICD-10-CM | POA: Diagnosis not present

## 2019-01-24 DIAGNOSIS — Z6825 Body mass index (BMI) 25.0-25.9, adult: Secondary | ICD-10-CM | POA: Diagnosis not present

## 2019-01-24 DIAGNOSIS — E119 Type 2 diabetes mellitus without complications: Secondary | ICD-10-CM | POA: Diagnosis not present

## 2019-01-24 DIAGNOSIS — K5900 Constipation, unspecified: Secondary | ICD-10-CM | POA: Diagnosis not present

## 2019-01-24 DIAGNOSIS — I10 Essential (primary) hypertension: Secondary | ICD-10-CM | POA: Diagnosis not present

## 2019-01-24 DIAGNOSIS — Z008 Encounter for other general examination: Secondary | ICD-10-CM | POA: Diagnosis not present

## 2019-01-24 DIAGNOSIS — Z0000 Encounter for general adult medical examination without abnormal findings: Secondary | ICD-10-CM | POA: Diagnosis not present

## 2019-01-24 DIAGNOSIS — T148XXA Other injury of unspecified body region, initial encounter: Secondary | ICD-10-CM | POA: Diagnosis not present

## 2019-01-24 DIAGNOSIS — E039 Hypothyroidism, unspecified: Secondary | ICD-10-CM | POA: Diagnosis not present

## 2019-01-24 DIAGNOSIS — R109 Unspecified abdominal pain: Secondary | ICD-10-CM | POA: Diagnosis not present

## 2019-01-24 DIAGNOSIS — E663 Overweight: Secondary | ICD-10-CM | POA: Diagnosis not present

## 2019-02-01 DIAGNOSIS — N281 Cyst of kidney, acquired: Secondary | ICD-10-CM | POA: Diagnosis not present

## 2019-02-01 DIAGNOSIS — K760 Fatty (change of) liver, not elsewhere classified: Secondary | ICD-10-CM | POA: Diagnosis not present

## 2019-02-01 DIAGNOSIS — R109 Unspecified abdominal pain: Secondary | ICD-10-CM | POA: Diagnosis not present

## 2019-02-01 DIAGNOSIS — K5900 Constipation, unspecified: Secondary | ICD-10-CM | POA: Diagnosis not present

## 2019-02-19 DIAGNOSIS — M25559 Pain in unspecified hip: Secondary | ICD-10-CM | POA: Diagnosis not present

## 2019-02-19 DIAGNOSIS — Z23 Encounter for immunization: Secondary | ICD-10-CM | POA: Diagnosis not present

## 2019-02-19 DIAGNOSIS — Z0001 Encounter for general adult medical examination with abnormal findings: Secondary | ICD-10-CM | POA: Diagnosis not present

## 2019-02-19 DIAGNOSIS — E663 Overweight: Secondary | ICD-10-CM | POA: Diagnosis not present

## 2019-02-19 DIAGNOSIS — E039 Hypothyroidism, unspecified: Secondary | ICD-10-CM | POA: Diagnosis not present

## 2019-02-19 DIAGNOSIS — E119 Type 2 diabetes mellitus without complications: Secondary | ICD-10-CM | POA: Diagnosis not present

## 2019-02-19 DIAGNOSIS — N6459 Other signs and symptoms in breast: Secondary | ICD-10-CM | POA: Diagnosis not present

## 2019-02-19 DIAGNOSIS — M25519 Pain in unspecified shoulder: Secondary | ICD-10-CM | POA: Diagnosis not present

## 2019-02-19 DIAGNOSIS — Z7189 Other specified counseling: Secondary | ICD-10-CM | POA: Diagnosis not present

## 2019-02-19 DIAGNOSIS — Z79899 Other long term (current) drug therapy: Secondary | ICD-10-CM | POA: Diagnosis not present

## 2019-02-19 DIAGNOSIS — Z008 Encounter for other general examination: Secondary | ICD-10-CM | POA: Diagnosis not present

## 2019-02-19 DIAGNOSIS — I10 Essential (primary) hypertension: Secondary | ICD-10-CM | POA: Diagnosis not present

## 2019-02-26 DIAGNOSIS — N6489 Other specified disorders of breast: Secondary | ICD-10-CM | POA: Diagnosis not present

## 2019-02-26 DIAGNOSIS — N6459 Other signs and symptoms in breast: Secondary | ICD-10-CM | POA: Diagnosis not present

## 2019-02-26 DIAGNOSIS — N644 Mastodynia: Secondary | ICD-10-CM | POA: Diagnosis not present

## 2019-02-26 DIAGNOSIS — R922 Inconclusive mammogram: Secondary | ICD-10-CM | POA: Diagnosis not present

## 2019-02-26 DIAGNOSIS — R928 Other abnormal and inconclusive findings on diagnostic imaging of breast: Secondary | ICD-10-CM | POA: Diagnosis not present

## 2019-04-03 ENCOUNTER — Ambulatory Visit: Payer: HMO

## 2019-04-12 ENCOUNTER — Other Ambulatory Visit: Payer: Self-pay

## 2019-04-12 NOTE — Patient Outreach (Signed)
  [facility]) Care Management Chronic Special Needs Program  04/12/2019  Name: Guylene Jentzsch DOB: 04-Nov-1950  MRN: [id number]  Ms. Niva Wisby is enrolled in a chronic special needs plan for Diabetes. Client called with no answer No answer and HIPAA compliant message left. 1st attempt Plan for 2nd outreach call in 2-3weeks Chronic care management coordinator will attempt outreach in 2-3 weeks.   Peter Garter RN, Jackquline Denmark, CDE Chronic Care Management Coordinator [place] [facility] Care Management [phone]

## 2019-04-13 ENCOUNTER — Other Ambulatory Visit: Payer: Self-pay

## 2019-04-13 NOTE — Patient Outreach (Signed)
  [facility]) Care Management Chronic Special Needs Program  04/13/2019  Name: Margaret Long DOB: 06-07-1950  MRN: [id number]  Margaret Long is enrolled in a chronic special needs plan for Diabetes. Reviewed and updated care plan. Client returned call from message yesterday 04/12/19  Subjective:Client states she has moved into a new apartment which is on the 2nd floor so she has been getting more exercise and down the stairs.  States she now has a new primary care provider at [facility] in [facility] Dr. Wynn Banker  States she saw her in September and she has an appt on 04/30/19.  States her blood sugars have been good ranging from 100-120 most days.  States she does have a problem watching her portion sizes especially with fruit.  States she has an eye exam scheduled for December.  Goals Addressed            This Visit's Progress   . Advanced Care Planning complete by 9 months(continue 04/13/19   On track   . Client understands the importance of follow-up with providers by attending scheduled visits   On track   . COMPLETED: Client will use Assistive Devices as needed and verbalize understanding of device use      . Client will verbalize knowledge of self management of Hypertension as evidences by BP reading of 140/90 or less; or as defined by provider   On track   . Maintain timely refills of diabetic medication as prescribed within the year .   On track   . Obtain Annual Eye (retinal)  Exam    On track    Keep scheduled eye exam    . COMPLETED: Obtain annual screen for micro albuminuria (urine) , nephropathy (kidney problems)       Completed 06/13/18     . COMPLETED: Obtain Hemoglobin A1C at least 2 times per year       Completed 06/13/18, 12/22/18     . COMPLETED: Visit Primary Care Provider or Endocrinologist at least 2 times per year        Saw provider 06/23/18, 12/22/18      Client is meeting diabetes self management goal of hemoglobin A1C of <7% with last  reading of 6.2% Reinforced to complete her Advanced Directives  Reviewed low CHO, low sodium diet and to watch portion sizes Reviewed portion sizes of fruit and suggested she measure out her grapes in a bowl to help make her more aware of the amount of fruit she is eating Encouraged to continue to be active and to try to increase the number of days she walks Reviewed number for 24-hour [place] 19 precautions  Plan:  Send successful outreach letter with a copy of their individualized care plan and Send individual care plan to provider  Chronic care management coordinator will outreach in:  4-6 Months     Peter Garter RN, [facility], [place] Management Coordinator [place] Management [phone]

## 2019-05-02 ENCOUNTER — Ambulatory Visit: Payer: Self-pay

## 2019-06-20 DIAGNOSIS — E119 Type 2 diabetes mellitus without complications: Secondary | ICD-10-CM | POA: Diagnosis not present

## 2019-06-20 DIAGNOSIS — H04123 Dry eye syndrome of bilateral lacrimal glands: Secondary | ICD-10-CM | POA: Diagnosis not present

## 2019-06-20 DIAGNOSIS — H2513 Age-related nuclear cataract, bilateral: Secondary | ICD-10-CM | POA: Diagnosis not present

## 2019-06-20 DIAGNOSIS — H52222 Regular astigmatism, left eye: Secondary | ICD-10-CM | POA: Diagnosis not present

## 2019-06-20 DIAGNOSIS — H524 Presbyopia: Secondary | ICD-10-CM | POA: Diagnosis not present

## 2019-06-20 DIAGNOSIS — H43393 Other vitreous opacities, bilateral: Secondary | ICD-10-CM | POA: Diagnosis not present

## 2019-07-27 ENCOUNTER — Other Ambulatory Visit: Payer: Self-pay

## 2019-07-27 NOTE — Patient Outreach (Signed)
[facility]) Care Management Chronic Special Needs Program  07/27/2019  Name: Margaret Long DOB: Jul 22, 1950  MRN: [id number]  Ms. Margaret Long is enrolled in a chronic special needs plan for Diabetes. Chronic Care Management Coordinator telephoned client to review health risk assessment and to develop individualized care plan.  Introduced the chronic care management program, importance of client participation, and taking their care plan to all provider appointments and inpatient facilities.  Reviewed the transition of care process and possible referral to community care management.  Subjective: Client states she has been doing good.  States she is still working 4 days a week.  States she is to have her labs drawn later this month and she will see her doctor a week later,  States she checks her blood sugars every day and they are always less than 120.  Denies any low blood sugars.  States her B/P has improved since her doctor added the amlodipine.  States she checks her B/P every day and her reading this morning was 131/84.  States it is always less than 140/90.  States she tries to watch her diet but she would like help with planning and cooking healthy meals.  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  States she was not able to find she Advanced Directives forms after her move so she would like to get more forms.  Denies any issues with affording or getting her medications.  States she has received both of her COVID shots  Goals Addressed            This Visit's Progress   . Advanced Care Planning complete by 12 months(continue 07/27/19   Not on track    Reviewed importance of having Advanced Directives   Plan to send Advanced Directives  forms Sent EMMI: Advanced Directives      . COMPLETED: Client understands the importance of follow-up with providers by attending scheduled visits   On track    Plan to keep scheduled appointments with  providers Reinforced to keep scheduled appointments with providers Goal completed 07/27/19    . COMPLETED: Client will use Assistive Devices as needed and verbalize understanding of device use       No issues with glucometer Goal completed 04/13/19    . Client will verbalize knowledge of self management of Hypertension as evidences by BP reading of 140/90 or less; or as defined by provider   On track    Reports B/P less than 140/90 when checking at home and at providers Plan to check B/P regularly Take B/P medications as ordered Plan to follow a low salt diet  Increase activity as tolerated EMMI: High Blood Pressure(Hypertension) What you can do    . Diabetes Patient stated goal to walk 30-45 minutes 4 times a week for the next 12 months (pt-stated)       Reviewed importance of regular exercise to blood sugar and B/P control    . HEMOGLOBIN A1C < 7.0       Last Hemoglobin A1C 6.2% 12/22/18 Reviewed fasting blood sugar goals of 80-130 and less than 180 1 1/2-2 hours after meals Reinforced to follow a low carbohydrate low salt diet and to watch portion sizes Reviewed use and possible side effects of diabetes medications  Reviewed signs and symptoms of hypoglycemia and actions to take Reviewed diabetes action plan in [place] calendar Discussed having [employer]HT[employer] Health Coach assist her with planning and cooking healthy meals.  Referred to [employer]) Health Coach    . COMPLETED: Maintain timely refills of diabetic medication as prescribed within the year .   On track    Maintaining timely refills of medications per dispense report Reinforced importance of getting medications refilled on time Goal completed 07/27/19    . Obtain annual  Lipid Profile, LDL-C   On track    Last completed 12/22/18 LDL 74 The goal for LDL is less than 70 mg/dL as you are at high risk for complications Try to avoid saturated fats, trans-fats and eat more fiber Plan to  take statin as ordered    . Obtain Annual Eye (retinal)  Exam    On track    Reports completed February 2021 Reinforced importance of having an annual dilated eye exam    . Obtain Annual Foot Exam   On track    Check your skin and feet every day for cuts, bruises, redness, blisters, or sores. Schedule a foot exam with your health care provider once every year    . Obtain annual screen for micro albuminuria (urine) , nephropathy (kidney problems)   On track    Last completed 06/13/18 It is important for your doctor to check your urine for protein at least every year     . Obtain Hemoglobin A1C at least 2 times per year   On track    Last completed  12/22/18 It is important to have your Hemoglobin A1C checked every 6 months if you are at goal and every 3 months if you are not at goal    . Visit Primary Care Provider or Endocrinologist at least 2 times per year    On track    Primary care provider reports has appointment end of March Please schedule your annual wellness visit      Client is  meeting diabetes self management goal of hemoglobin A1C of <7% with last reading of 6.2% Reviewed number for 24-hour nurse Line Reviewed COVID 19 precautions Plan:  Send successful outreach letter with a copy of their individualized care plan, Send individual care plan to provider and Send educational material Advanced Directives packet, EMMI-High Blood Pressure(Hypertension): What you can do, Advanced Directives   Chronic care management coordination will outreach in:  12 months  Will refer client to:  [employer]) [place] RN, [employer], [place] Management Coordinator [place] Management [phone]

## 2019-08-20 DIAGNOSIS — Z79899 Other long term (current) drug therapy: Secondary | ICD-10-CM | POA: Diagnosis not present

## 2019-11-07 DIAGNOSIS — Z79899 Other long term (current) drug therapy: Secondary | ICD-10-CM | POA: Diagnosis not present

## 2019-11-30 DIAGNOSIS — G8929 Other chronic pain: Secondary | ICD-10-CM | POA: Diagnosis not present

## 2019-11-30 DIAGNOSIS — M25522 Pain in left elbow: Secondary | ICD-10-CM | POA: Diagnosis not present

## 2019-11-30 DIAGNOSIS — S40022A Contusion of left upper arm, initial encounter: Secondary | ICD-10-CM | POA: Diagnosis not present

## 2019-11-30 DIAGNOSIS — E559 Vitamin D deficiency, unspecified: Secondary | ICD-10-CM | POA: Diagnosis not present

## 2019-11-30 DIAGNOSIS — E119 Type 2 diabetes mellitus without complications: Secondary | ICD-10-CM | POA: Diagnosis not present

## 2019-11-30 DIAGNOSIS — T148XXA Other injury of unspecified body region, initial encounter: Secondary | ICD-10-CM | POA: Diagnosis not present

## 2020-03-18 ENCOUNTER — Other Ambulatory Visit: Payer: Self-pay

## 2020-03-18 NOTE — Patient Outreach (Signed)
  [facility]) Care Management Chronic Special Needs Program    03/18/2020  Name: Margaret Long, DOB: 06/10/1950  MRN: [id number]   Ms. Quinnie Barcelo is enrolled in a chronic special needs plan for Diabetes.  Member will be followed by the [employer] Case Management team beginning May 24, 2020. Case is closed by [place] Management, will reopen if needed before 05/24/2020 [employer] Case Management Team will follow member moving forward with assessments, care plans and documentation Peter Garter RN, [facility], [place] Management [phone]

## 2020-03-21 NOTE — Patient Outreach (Signed)
  [facility]) Care Management Chronic Special Needs Program    03/21/2020  Name: Margaret Long, DOB: [birth year]  MRN: [id number]   Margaret Long is enrolled in a chronic special needs plan for Diabetes.  Client will be followed by the [employer] Case Management team beginning May 24, 2020. Margaret Long will continue to follow client through 05/23/20  Margaret Long, Margaret Long, [place] Management [phone]

## 2020-05-27 ENCOUNTER — Other Ambulatory Visit: Payer: Self-pay

## 2020-06-19 DIAGNOSIS — H2513 Age-related nuclear cataract, bilateral: Secondary | ICD-10-CM | POA: Diagnosis not present

## 2020-06-19 DIAGNOSIS — H52222 Regular astigmatism, left eye: Secondary | ICD-10-CM | POA: Diagnosis not present

## 2020-06-19 DIAGNOSIS — E119 Type 2 diabetes mellitus without complications: Secondary | ICD-10-CM | POA: Diagnosis not present

## 2020-06-19 DIAGNOSIS — H04123 Dry eye syndrome of bilateral lacrimal glands: Secondary | ICD-10-CM | POA: Diagnosis not present

## 2020-06-19 DIAGNOSIS — H43393 Other vitreous opacities, bilateral: Secondary | ICD-10-CM | POA: Diagnosis not present

## 2020-06-19 DIAGNOSIS — H524 Presbyopia: Secondary | ICD-10-CM | POA: Diagnosis not present

## 2020-07-18 DIAGNOSIS — Z1231 Encounter for screening mammogram for malignant neoplasm of breast: Secondary | ICD-10-CM | POA: Diagnosis not present

## 2020-07-21 ENCOUNTER — Ambulatory Visit: Payer: HMO

## 2020-07-22 DIAGNOSIS — E039 Hypothyroidism, unspecified: Secondary | ICD-10-CM | POA: Diagnosis not present

## 2020-07-22 DIAGNOSIS — E559 Vitamin D deficiency, unspecified: Secondary | ICD-10-CM | POA: Diagnosis not present

## 2020-07-22 DIAGNOSIS — E1169 Type 2 diabetes mellitus with other specified complication: Secondary | ICD-10-CM | POA: Diagnosis not present

## 2020-07-22 DIAGNOSIS — E2839 Other primary ovarian failure: Secondary | ICD-10-CM | POA: Diagnosis not present

## 2020-07-22 DIAGNOSIS — R011 Cardiac murmur, unspecified: Secondary | ICD-10-CM | POA: Diagnosis not present

## 2020-07-22 DIAGNOSIS — E538 Deficiency of other specified B group vitamins: Secondary | ICD-10-CM | POA: Diagnosis not present

## 2020-07-22 DIAGNOSIS — D649 Anemia, unspecified: Secondary | ICD-10-CM | POA: Diagnosis not present

## 2020-07-22 DIAGNOSIS — I10 Essential (primary) hypertension: Secondary | ICD-10-CM | POA: Diagnosis not present

## 2020-07-22 DIAGNOSIS — E785 Hyperlipidemia, unspecified: Secondary | ICD-10-CM | POA: Diagnosis not present

## 2020-07-29 ENCOUNTER — Other Ambulatory Visit: Payer: Self-pay | Admitting: Family Medicine

## 2020-07-29 DIAGNOSIS — E2839 Other primary ovarian failure: Secondary | ICD-10-CM

## 2020-08-13 DIAGNOSIS — K648 Other hemorrhoids: Secondary | ICD-10-CM | POA: Diagnosis not present

## 2020-08-13 DIAGNOSIS — Z1211 Encounter for screening for malignant neoplasm of colon: Secondary | ICD-10-CM | POA: Diagnosis not present

## 2020-08-13 DIAGNOSIS — K640 First degree hemorrhoids: Secondary | ICD-10-CM | POA: Diagnosis not present

## 2020-08-13 DIAGNOSIS — Z86010 Personal history of colonic polyps: Secondary | ICD-10-CM | POA: Diagnosis not present

## 2020-08-13 DIAGNOSIS — D124 Benign neoplasm of descending colon: Secondary | ICD-10-CM | POA: Diagnosis not present

## 2020-08-13 DIAGNOSIS — K635 Polyp of colon: Secondary | ICD-10-CM | POA: Diagnosis not present

## 2020-08-13 DIAGNOSIS — Z8719 Personal history of other diseases of the digestive system: Secondary | ICD-10-CM | POA: Diagnosis not present

## 2020-08-26 DIAGNOSIS — D649 Anemia, unspecified: Secondary | ICD-10-CM | POA: Diagnosis not present

## 2020-10-24 DIAGNOSIS — R0602 Shortness of breath: Secondary | ICD-10-CM | POA: Diagnosis not present

## 2020-10-24 DIAGNOSIS — R0600 Dyspnea, unspecified: Secondary | ICD-10-CM | POA: Diagnosis not present

## 2020-10-24 DIAGNOSIS — E039 Hypothyroidism, unspecified: Secondary | ICD-10-CM | POA: Diagnosis not present

## 2020-10-24 DIAGNOSIS — N281 Cyst of kidney, acquired: Secondary | ICD-10-CM | POA: Diagnosis not present

## 2020-10-24 DIAGNOSIS — E538 Deficiency of other specified B group vitamins: Secondary | ICD-10-CM | POA: Diagnosis not present

## 2020-10-24 DIAGNOSIS — R399 Unspecified symptoms and signs involving the genitourinary system: Secondary | ICD-10-CM | POA: Diagnosis not present

## 2020-10-27 ENCOUNTER — Other Ambulatory Visit: Payer: Self-pay | Admitting: Family Medicine

## 2020-10-27 DIAGNOSIS — N281 Cyst of kidney, acquired: Secondary | ICD-10-CM

## 2020-11-04 ENCOUNTER — Ambulatory Visit
Admission: RE | Admit: 2020-11-04 | Discharge: 2020-11-04 | Disposition: A | Discharge status: Home / Self Care | Payer: HMO | Source: Ambulatory Visit | Attending: Family Medicine | Admitting: Family Medicine

## 2020-11-04 DIAGNOSIS — N281 Cyst of kidney, acquired: Secondary | ICD-10-CM | POA: Diagnosis not present

## 2020-12-28 NOTE — Progress Notes (Signed)
Cardiology Office Note:    Date:  12/30/2020   ID:  Margaret Long, DOB Dec 28, 1950, MRN [id number]  PCP:  Margaret Pepper, MD  Cardiologist:  None  Electrophysiologist:  None   Referring MD: Maurice Small, MD   Chief Complaint  Patient presents with   Chest Pain    History of Present Illness:    Margaret Long is a 70 y.o. female with a hx of T2DM, hypertension, hyperlipidemia, hypothyroidism, former tobacco use who is referred by Dr. Justin Mend for evaluation of dyspnea on exertion.  She reports that she has been having chest pain and dyspnea with minimal exertion.  Reports reliably has symptoms when she walks up stairs.  Describes burning in center of her chest and shortness of breath that resolves within 1 to 2 minutes of rest.  Feels lightheaded during episode, denies any syncope.  Denies any palpitations or lower extremity edema.  She smoked almost 1 pack/day for 36 years, quit in 2005.  Family history includes brother had multiple MIs, first in late 50s/early 43s, and sister has PPM.  Labs on 6//3/22 showed TSH 0.36, creatinine 0.79, potassium 4.0, normal LFTs, WBC 6.1, hemoglobin 11.3, platelets 349.  Past Medical History:  Diagnosis Date   Diabetes mellitus without complication (HCC)    High cholesterol    Thyroid disease     No past surgical history on file.  Current Medications: Current Meds  Medication Sig   amLODipine (NORVASC) 10 MG tablet Take 10 mg by mouth daily.   aspirin 81 MG EC tablet Take 1 tablet by mouth daily.   Cholecalciferol 25 MCG (1000 UT) capsule Take 1,000 Units by mouth daily.   cyanocobalamin 1000 MCG tablet Take 1 tablet by mouth daily.   famotidine (PEPCID) 20 MG tablet Take by mouth.   levothyroxine (SYNTHROID) 75 MCG tablet Take 1 tablet by mouth daily.   losartan (COZAAR) 100 MG tablet 1 tablet daily.   metFORMIN (GLUCOPHAGE) 1000 MG tablet 1 tablet 2 (two) times daily with a meal.   rosuvastatin (CRESTOR) 20 MG tablet 1 tablet daily.    [DISCONTINUED] atorvastatin (LIPITOR) 40 MG tablet Take 1 tablet by mouth daily.   [DISCONTINUED] Cholecalciferol (D2000 ULTRA STRENGTH) 50 MCG (2000 UT) CAPS Take by mouth.   [DISCONTINUED] Levothyroxine Sodium (SYNTHROID PO) Take 75 mcg by mouth daily.      Allergies:   Patient has no known allergies.   Social History   Socioeconomic History   Marital status: Widowed    Spouse name: Not on file   Number of children: Not on file   Years of education: Not on file   Highest education level: Not on file  Occupational History   Not on file  Tobacco Use   Smoking status: Never   Smokeless tobacco: Never  Substance and Sexual Activity   Alcohol use: No   Drug use: No   Sexual activity: Not on file  Other Topics Concern   Not on file  Social History Narrative   Not on file   Social Determinants of Health   Financial Resource Strain: Not on file  Food Insecurity: Not on file  Transportation Needs: Not on file  Physical Activity: Not on file  Stress: Not on file  Social Connections: Not on file     Family History: Family history includes brother had multiple MIs, first in late 50s/early 17s, and sister has PPM.  ROS:   Please see the history of present illness.     All other systems  reviewed and are negative.  EKGs/Labs/Other Studies Reviewed:    The following studies were reviewed today:   EKG:  EKG is ordered today.  The ekg ordered today demonstrates NSR, rate 93, nonspecific T wave flattening  Recent Labs: No results found for requested labs within last 8760 hours.  Recent Lipid Panel No results found for: CHOL, TRIG, HDL, CHOLHDL, VLDL, LDLCALC, LDLDIRECT  Physical Exam:    VS:  BP 126/66   Pulse 93   Ht '5\' 6"'$  (1.676 m)   Wt 153 lb 9.6 oz (69.7 kg)   SpO2 96%   BMI 24.79 kg/m     Wt Readings from Last 3 Encounters:  12/30/20 153 lb 9.6 oz (69.7 kg)  07/12/15 151 lb (68.5 kg)  08/20/13 155 lb (70.3 kg)     GEN:  Well nourished, well developed in  no acute distress HEENT: Normal NECK: No JVD; No carotid bruits LYMPHATICS: No lymphadenopathy CARDIAC: RRR, no murmurs, rubs, gallops RESPIRATORY:  Clear to auscultation without rales, wheezing or rhonchi  ABDOMEN: Soft, non-tender, non-distended MUSCULOSKELETAL:  No edema; No deformity  SKIN: Warm and dry NEUROLOGIC:  Alert and oriented x 3 PSYCHIATRIC:  Normal affect   ASSESSMENT:    1. Chest pain of uncertain etiology   2. DOE (dyspnea on exertion)   3. Hyperlipidemia, unspecified hyperlipidemia type   4. Essential hypertension    PLAN:    Chest pain/dyspnea on exertion: Description concerning for angina, has describes burning chest pain that occurs with exertion and relieved with rest.  Does have significant CAD risk factors (hypertension, hyperlipidemia, T2DM, former tobacco use, family history). -Coronary CTA to evaluate for obstructive CAD.  Will give Lopressor 100 mg prior to study -Echocardiogram -As needed sublingual nitroglycerin  Hypertension: On amlodipine 10 mg daily and losartan 100 mg daily.  Appears controlled  Hyperlipidemia: On rosuvastatin 20 mg every other day.  Will check lipid panel  T2DM: On metformin.  Hemoglobin A1c 6.9% on 07/25/2020.  RTC in 6 months   Medication Adjustments/Labs and Tests Ordered: Current medicines are reviewed at length with the patient today.  Concerns regarding medicines are outlined above.  No orders of the defined types were placed in this encounter.  No orders of the defined types were placed in this encounter.   Patient Instructions  Medication Instructions:  Take sublingual nitroglycerin AS NEEDED for chest pain  *If you need a refill on your cardiac medications before your next appointment, please call your pharmacy*   Lab Work: BMET, Lipid panel prior to CT scan  If you have labs (blood work) drawn today and your tests are completely normal, you will receive your results only by: [place] (if you have  MyChart) OR A paper copy in the mail If you have any lab test that is abnormal or we need to change your treatment, we will call you to review the results.   Testing/Procedures: Coronary CTA-see instructions below  Your physician has requested that you have an echocardiogram. Echocardiography is a painless test that uses sound waves to create images of your heart. It provides your doctor with information about the size and shape of your heart and how well your heart's chambers and valves are working. This procedure takes approximately one hour. There are no restrictions for this procedure.  Follow-Up: At [facility], you and your health needs are our priority.  As part of our continuing mission to provide you with exceptional heart care, we have created designated Provider Care Teams.  These Care Teams include your primary Cardiologist (physician) and Advanced Practice Providers (APPs -  Physician Assistants and Nurse Practitioners) who all work together to provide you with the care you need, when you need it.  We recommend signing up for the patient portal called "MyChart".  Sign up information is provided on this After Visit Summary.  MyChart is used to connect with patients for Virtual Visits (Telemedicine).  Patients are able to view lab/test results, encounter notes, upcoming appointments, etc.  Non-urgent messages can be sent to your provider as well.   To learn more about what you can do with MyChart, go to [email or web address].    Your next appointment:   6 month(s)  The format for your next appointment:   In Person  Provider:   Oswaldo Milian, MD   Other Instructions   Your cardiac CT will be scheduled at one of the below locations:   [facility] [address] [place], [place] [address] [phone]  If scheduled at [facility], please arrive at the [facility] main entrance (entrance A) of [facility] 30 minutes prior to test  start time. Proceed to the [facility] Radiology Department (first floor) to check-in and test prep.  Please follow these instructions carefully (unless otherwise directed):  On the Night Before the Test: Be sure to Drink plenty of water. Do not consume any caffeinated/decaffeinated beverages or chocolate 12 hours prior to your test. Do not take any antihistamines 12 hours prior to your test.  On the Day of the Test: Drink plenty of water until 1 hour prior to the test. Do not eat any food 4 hours prior to the test. You may take your regular medications prior to the test.  Take metoprolol (Lopressor) two hours prior to test. FEMALES- please wear underwire-free bra if available, avoid dresses & tight clothing  After the Test: Drink plenty of water. After receiving IV contrast, you may experience a mild flushed feeling. This is normal. On occasion, you may experience a mild rash up to 24 hours after the test. This is not dangerous. If this occurs, you can take Benadryl 25 mg and increase your fluid intake. If you experience trouble breathing, this can be serious. If it is severe call 911 IMMEDIATELY. If it is mild, please call our office. If you take any of these medications: Glipizide/Metformin, Avandament, Glucavance, please do not take 48 hours after completing test unless otherwise instructed.  Please allow 2-4 weeks for scheduling of routine cardiac CTs. Some insurance companies require a pre-authorization which may delay scheduling of this test.   For non-scheduling related questions, please contact the cardiac imaging nurse navigator should you have any questions/concerns: Marchia Bond, Cardiac Imaging Nurse Navigator Gordy Clement, Cardiac Imaging Nurse Navigator [place] [facility] Direct Office Dial: [phone]   For scheduling needs, including cancellations and rescheduling, please call [place], [phone].    Signed, Donato Heinz, MD   12/30/2020 9:14 AM    [place]

## 2020-12-30 ENCOUNTER — Other Ambulatory Visit: Payer: Self-pay

## 2020-12-30 ENCOUNTER — Ambulatory Visit (HOSPITAL_BASED_OUTPATIENT_CLINIC_OR_DEPARTMENT_OTHER): Payer: HMO | Admitting: Cardiology

## 2020-12-30 VITALS — BP 126/66 | HR 93 | Ht 66.0 in | Wt 153.6 lb

## 2020-12-30 DIAGNOSIS — E785 Hyperlipidemia, unspecified: Secondary | ICD-10-CM

## 2020-12-30 DIAGNOSIS — R0600 Dyspnea, unspecified: Secondary | ICD-10-CM

## 2020-12-30 DIAGNOSIS — R0609 Other forms of dyspnea: Secondary | ICD-10-CM

## 2020-12-30 DIAGNOSIS — I10 Essential (primary) hypertension: Secondary | ICD-10-CM | POA: Diagnosis not present

## 2020-12-30 DIAGNOSIS — R079 Chest pain, unspecified: Secondary | ICD-10-CM | POA: Diagnosis not present

## 2020-12-30 MED ORDER — METOPROLOL TARTRATE 100 MG PO TABS: ORAL_TABLET | ORAL | 0 refills | Status: DC

## 2020-12-30 MED ORDER — NITROGLYCERIN 0.4 MG SL SUBL: 0.4000 mg | SUBLINGUAL_TABLET | SUBLINGUAL | 3 refills | Status: DC | PRN

## 2020-12-30 NOTE — Patient Instructions (Signed)
Medication Instructions:  Take sublingual nitroglycerin AS NEEDED for chest pain  *If you need a refill on your cardiac medications before your next appointment, please call your pharmacy*   Lab Work: BMET, Lipid panel prior to CT scan  If you have labs (blood work) drawn today and your tests are completely normal, you will receive your results only by: [place] (if you have MyChart) OR A paper copy in the mail If you have any lab test that is abnormal or we need to change your treatment, we will call you to review the results.   Testing/Procedures: Coronary CTA-see instructions below  Your physician has requested that you have an echocardiogram. Echocardiography is a painless test that uses sound waves to create images of your heart. It provides your doctor with information about the size and shape of your heart and how well your heart's chambers and valves are working. This procedure takes approximately one hour. There are no restrictions for this procedure.  Follow-Up: At [facility], you and your health needs are our priority.  As part of our continuing mission to provide you with exceptional heart care, we have created designated Provider Care Teams.  These Care Teams include your primary Cardiologist (physician) and Advanced Practice Providers (APPs -  Physician Assistants and Nurse Practitioners) who all work together to provide you with the care you need, when you need it.  We recommend signing up for the patient portal called "MyChart".  Sign up information is provided on this After Visit Summary.  MyChart is used to connect with patients for Virtual Visits (Telemedicine).  Patients are able to view lab/test results, encounter notes, upcoming appointments, etc.  Non-urgent messages can be sent to your provider as well.   To learn more about what you can do with MyChart, go to [email or web address].    Your next appointment:   6 month(s)  The format for your next  appointment:   In Person  Provider:   Oswaldo Milian, MD   Other Instructions   Your cardiac CT will be scheduled at one of the below locations:   [facility] [address] [place], [place] [address] [phone]  If scheduled at [facility], please arrive at the [facility] main entrance (entrance A) of [facility] 30 minutes prior to test start time. Proceed to the [facility] Radiology Department (first floor) to check-in and test prep.  Please follow these instructions carefully (unless otherwise directed):  On the Night Before the Test: Be sure to Drink plenty of water. Do not consume any caffeinated/decaffeinated beverages or chocolate 12 hours prior to your test. Do not take any antihistamines 12 hours prior to your test.  On the Day of the Test: Drink plenty of water until 1 hour prior to the test. Do not eat any food 4 hours prior to the test. You may take your regular medications prior to the test.  Take metoprolol (Lopressor) two hours prior to test. FEMALES- please wear underwire-free bra if available, avoid dresses & tight clothing  After the Test: Drink plenty of water. After receiving IV contrast, you may experience a mild flushed feeling. This is normal. On occasion, you may experience a mild rash up to 24 hours after the test. This is not dangerous. If this occurs, you can take Benadryl 25 mg and increase your fluid intake. If you experience trouble breathing, this can be serious. If it is severe call 911 IMMEDIATELY. If it is mild, please call  our office. If you take any of these medications: Glipizide/Metformin, Avandament, Glucavance, please do not take 48 hours after completing test unless otherwise instructed.  Please allow 2-4 weeks for scheduling of routine cardiac CTs. Some insurance companies require a pre-authorization which may delay scheduling of this test.   For non-scheduling related questions, please  contact the cardiac imaging nurse navigator should you have any questions/concerns: Marchia Bond, Cardiac Imaging Nurse Navigator Gordy Clement, Cardiac Imaging Nurse Navigator  Heart and Vascular Services Direct Office Dial: [phone]   For scheduling needs, including cancellations and rescheduling, please call [place], [phone].

## 2021-01-01 DIAGNOSIS — R0600 Dyspnea, unspecified: Secondary | ICD-10-CM | POA: Diagnosis not present

## 2021-01-01 DIAGNOSIS — E785 Hyperlipidemia, unspecified: Secondary | ICD-10-CM | POA: Diagnosis not present

## 2021-01-01 DIAGNOSIS — R079 Chest pain, unspecified: Secondary | ICD-10-CM | POA: Diagnosis not present

## 2021-01-01 LAB — BASIC METABOLIC PANEL
BUN/Creatinine Ratio: 11 — ABNORMAL LOW (ref 12–28)
BUN: 8 mg/dL (ref 8–27)
CO2: 21 mmol/L (ref 20–29)
Calcium: 9.5 mg/dL (ref 8.7–10.3)
Chloride: 101 mmol/L (ref 96–106)
Creatinine, Ser: 0.76 mg/dL (ref 0.57–1.00)
Glucose: 147 mg/dL — ABNORMAL HIGH (ref 65–99)
Potassium: 4 mmol/L (ref 3.5–5.2)
Sodium: 138 mmol/L (ref 134–144)
eGFR: 85 mL/min/{1.73_m2} (ref >59)

## 2021-01-01 LAB — LIPID PANEL
Chol/HDL Ratio: 3.7 ratio (ref 0.0–4.4)
Cholesterol, Total: 134 mg/dL (ref 100–199)
HDL: 36 mg/dL — ABNORMAL LOW (ref >39)
LDL Chol Calc (NIH): 79 mg/dL (ref 0–99)
Triglycerides: 101 mg/dL (ref 0–149)
VLDL Cholesterol Cal: 19 mg/dL (ref 5–40)

## 2021-01-07 ENCOUNTER — Telehealth (HOSPITAL_COMMUNITY): Payer: Self-pay | Admitting: *Deleted

## 2021-01-07 NOTE — Telephone Encounter (Signed)
Reaching out to patient to offer assistance regarding upcoming cardiac imaging study; pt verbalizes understanding of appt date/time, parking situation and where to check in, pre-test NPO status and medications ordered, and verified current allergies; name and call back number provided for further questions should they arise  Gordy Clement RN Navigator Cardiac Imaging [facility] [phone] office [phone] cell  Patient to take '100mg'$  metoprolol tartrate 2 hours prior to cardiac CT.

## 2021-01-08 ENCOUNTER — Other Ambulatory Visit: Payer: Self-pay

## 2021-01-08 ENCOUNTER — Other Ambulatory Visit (HOSPITAL_COMMUNITY): Payer: Self-pay | Admitting: Emergency Medicine

## 2021-01-08 ENCOUNTER — Ambulatory Visit (HOSPITAL_COMMUNITY)
Admission: RE | Admit: 2021-01-08 | Discharge: 2021-01-08 | Disposition: A | Discharge status: Home / Self Care | Payer: HMO | Source: Ambulatory Visit | Attending: Cardiology | Admitting: Cardiology

## 2021-01-08 DIAGNOSIS — R931 Abnormal findings on diagnostic imaging of heart and coronary circulation: Secondary | ICD-10-CM | POA: Insufficient documentation

## 2021-01-08 DIAGNOSIS — I700 Atherosclerosis of aorta: Secondary | ICD-10-CM | POA: Insufficient documentation

## 2021-01-08 DIAGNOSIS — R0609 Other forms of dyspnea: Secondary | ICD-10-CM

## 2021-01-08 DIAGNOSIS — R0600 Dyspnea, unspecified: Secondary | ICD-10-CM | POA: Diagnosis not present

## 2021-01-08 DIAGNOSIS — R079 Chest pain, unspecified: Secondary | ICD-10-CM | POA: Diagnosis not present

## 2021-01-08 DIAGNOSIS — I2510 Atherosclerotic heart disease of native coronary artery without angina pectoris: Secondary | ICD-10-CM | POA: Diagnosis not present

## 2021-01-08 DIAGNOSIS — K449 Diaphragmatic hernia without obstruction or gangrene: Secondary | ICD-10-CM | POA: Insufficient documentation

## 2021-01-08 MED ORDER — NITROGLYCERIN 0.4 MG SL SUBL
SUBLINGUAL_TABLET | SUBLINGUAL | Status: AC
  Filled 2021-01-08: qty 2

## 2021-01-08 MED ORDER — IOHEXOL 350 MG/ML SOLN
95.0000 mL | Freq: Once | INTRAVENOUS | Status: AC | PRN
  Administered 2021-01-08: 95 mL via INTRAVENOUS

## 2021-01-08 MED ORDER — NITROGLYCERIN 0.4 MG SL SUBL
0.8000 mg | SUBLINGUAL_TABLET | Freq: Once | SUBLINGUAL | Status: AC
  Administered 2021-01-08: 0.8 mg via SUBLINGUAL

## 2021-01-09 ENCOUNTER — Other Ambulatory Visit: Payer: Self-pay | Admitting: *Deleted

## 2021-01-09 DIAGNOSIS — R0600 Dyspnea, unspecified: Secondary | ICD-10-CM | POA: Diagnosis not present

## 2021-01-09 DIAGNOSIS — E1165 Type 2 diabetes mellitus with hyperglycemia: Secondary | ICD-10-CM | POA: Diagnosis not present

## 2021-01-09 DIAGNOSIS — Q6100 Congenital renal cyst, unspecified: Secondary | ICD-10-CM | POA: Diagnosis not present

## 2021-01-09 DIAGNOSIS — Z7984 Long term (current) use of oral hypoglycemic drugs: Secondary | ICD-10-CM | POA: Diagnosis not present

## 2021-01-09 DIAGNOSIS — I2510 Atherosclerotic heart disease of native coronary artery without angina pectoris: Secondary | ICD-10-CM | POA: Diagnosis not present

## 2021-01-09 DIAGNOSIS — E1169 Type 2 diabetes mellitus with other specified complication: Secondary | ICD-10-CM | POA: Diagnosis not present

## 2021-01-09 DIAGNOSIS — M2550 Pain in unspecified joint: Secondary | ICD-10-CM | POA: Diagnosis not present

## 2021-01-09 MED ORDER — ROSUVASTATIN CALCIUM 20 MG PO TABS: 20.0000 mg | ORAL_TABLET | Freq: Every day | ORAL | 3 refills | Status: DC

## 2021-01-14 ENCOUNTER — Other Ambulatory Visit: Payer: Self-pay

## 2021-01-14 ENCOUNTER — Ambulatory Visit (HOSPITAL_COMMUNITY): Payer: HMO | Attending: Cardiovascular Disease

## 2021-01-14 DIAGNOSIS — R0600 Dyspnea, unspecified: Secondary | ICD-10-CM

## 2021-01-14 DIAGNOSIS — R079 Chest pain, unspecified: Secondary | ICD-10-CM | POA: Diagnosis not present

## 2021-01-14 DIAGNOSIS — R0609 Other forms of dyspnea: Secondary | ICD-10-CM

## 2021-01-15 DIAGNOSIS — E039 Hypothyroidism, unspecified: Secondary | ICD-10-CM | POA: Diagnosis not present

## 2021-01-15 DIAGNOSIS — M791 Myalgia, unspecified site: Secondary | ICD-10-CM | POA: Diagnosis not present

## 2021-01-15 DIAGNOSIS — M2550 Pain in unspecified joint: Secondary | ICD-10-CM | POA: Diagnosis not present

## 2021-01-15 DIAGNOSIS — E119 Type 2 diabetes mellitus without complications: Secondary | ICD-10-CM | POA: Diagnosis not present

## 2021-01-15 DIAGNOSIS — I10 Essential (primary) hypertension: Secondary | ICD-10-CM | POA: Diagnosis not present

## 2021-01-15 DIAGNOSIS — M359 Systemic involvement of connective tissue, unspecified: Secondary | ICD-10-CM | POA: Diagnosis not present

## 2021-01-15 LAB — ECHOCARDIOGRAM COMPLETE
Area-P 1/2: 3.37 cm2
S' Lateral: 2.05 cm

## 2021-01-16 ENCOUNTER — Encounter: Payer: Self-pay | Admitting: *Deleted

## 2021-01-23 DIAGNOSIS — E039 Hypothyroidism, unspecified: Secondary | ICD-10-CM | POA: Diagnosis not present

## 2021-01-23 DIAGNOSIS — E1169 Type 2 diabetes mellitus with other specified complication: Secondary | ICD-10-CM | POA: Diagnosis not present

## 2021-01-23 DIAGNOSIS — D649 Anemia, unspecified: Secondary | ICD-10-CM | POA: Diagnosis not present

## 2021-01-23 DIAGNOSIS — E785 Hyperlipidemia, unspecified: Secondary | ICD-10-CM | POA: Diagnosis not present

## 2021-01-23 DIAGNOSIS — I10 Essential (primary) hypertension: Secondary | ICD-10-CM | POA: Diagnosis not present

## 2021-01-28 ENCOUNTER — Encounter: Payer: Self-pay | Admitting: *Deleted

## 2021-02-25 DIAGNOSIS — R1084 Generalized abdominal pain: Secondary | ICD-10-CM | POA: Diagnosis not present

## 2021-02-25 DIAGNOSIS — N281 Cyst of kidney, acquired: Secondary | ICD-10-CM | POA: Diagnosis not present

## 2021-03-12 ENCOUNTER — Other Ambulatory Visit: Payer: Self-pay | Admitting: Urology

## 2021-03-12 DIAGNOSIS — N281 Cyst of kidney, acquired: Secondary | ICD-10-CM

## 2021-03-25 ENCOUNTER — Ambulatory Visit
Admission: RE | Admit: 2021-03-25 | Discharge: 2021-03-25 | Disposition: A | Discharge status: Home / Self Care | Payer: HMO | Source: Ambulatory Visit | Attending: Urology | Admitting: Urology

## 2021-03-25 ENCOUNTER — Other Ambulatory Visit: Payer: Self-pay

## 2021-03-25 DIAGNOSIS — K760 Fatty (change of) liver, not elsewhere classified: Secondary | ICD-10-CM | POA: Diagnosis not present

## 2021-03-25 DIAGNOSIS — N281 Cyst of kidney, acquired: Secondary | ICD-10-CM

## 2021-03-25 DIAGNOSIS — M549 Dorsalgia, unspecified: Secondary | ICD-10-CM | POA: Diagnosis not present

## 2021-03-25 DIAGNOSIS — I10 Essential (primary) hypertension: Secondary | ICD-10-CM | POA: Diagnosis not present

## 2021-03-25 DIAGNOSIS — I700 Atherosclerosis of aorta: Secondary | ICD-10-CM | POA: Diagnosis not present

## 2021-03-25 MED ORDER — GADOBENATE DIMEGLUMINE 529 MG/ML IV SOLN
14.0000 mL | Freq: Once | INTRAVENOUS | Status: AC | PRN
  Administered 2021-03-25: 14 mL via INTRAVENOUS

## 2021-06-28 NOTE — Progress Notes (Signed)
Cardiology Office Note:    Date:  07/03/2021   ID:  Margaret Long, DOB 1950/11/11, MRN [id number]  PCP:  London Pepper, MD  Cardiologist:  None  Electrophysiologist:  None   Referring MD: London Pepper, MD   Chief Complaint  Patient presents with   Chest Pain    History of Present Illness:    Margaret Long is a 71 y.o. female with a hx of T2DM, hypertension, hyperlipidemia, hypothyroidism, former tobacco use who presents for follow-up.  She was referred by Dr. Justin Mend for evaluation of dyspnea on exertion, initially seen on 12/30/2020.  She reported that she has been having chest pain and dyspnea with minimal exertion.  Reports reliably has symptoms when she walks up stairs.  Describes burning in center of her chest and shortness of breath that resolves within 1 to 2 minutes of rest.  Feels lightheaded during episode, denies any syncope.  Denies any palpitations or lower extremity edema.  She smoked almost 1 pack/day for 36 years, quit in 2005.  Family history includes brother had multiple MIs, first in late 50s/early 63s, and sister has PPM.  Echocardiogram on 01/15/2021 showed normal biventricular function, no significant valvular disease.  Coronary CTA on 01/08/2021 showed calcium score 311 (89th percentile), nonobstructive CAD.  Since last clinic visit, she reports that she is doing okay.  Continues to have chest pain 2-3 times per week.  Describes as burning in center of her chest that occurs with exertion.  Particularly notices it when she is carrying groceries up stairs.  Reports BP has been well controlled, 120s or less when checks at home.   Past Medical History:  Diagnosis Date   Diabetes mellitus without complication (HCC)    High cholesterol    Thyroid disease     No past surgical history on file.  Current Medications: Current Meds  Medication Sig   amLODipine (NORVASC) 10 MG tablet Take 10 mg by mouth daily.   aspirin 81 MG EC tablet Take 1 tablet by mouth daily.    Cholecalciferol 25 MCG (1000 UT) capsule Take 1,000 Units by mouth daily.   cyanocobalamin 1000 MCG tablet Take 1 tablet by mouth daily.   famotidine (PEPCID) 20 MG tablet Take by mouth.   levothyroxine (SYNTHROID) 75 MCG tablet Take 1 tablet by mouth daily.   losartan (COZAAR) 100 MG tablet 1 tablet daily.   metFORMIN (GLUCOPHAGE) 1000 MG tablet 1 tablet 2 (two) times daily with a meal.   metoprolol tartrate (LOPRESSOR) 100 MG tablet Take 100 mg (1 tablet) 2 hours prior to CT scan   metoprolol tartrate (LOPRESSOR) 25 MG tablet Take 1 tablet (25 mg total) by mouth 2 (two) times daily.   rosuvastatin (CRESTOR) 20 MG tablet Take 1 tablet (20 mg total) by mouth daily.     Allergies:   Lisinopril   Social History   Socioeconomic History   Marital status: Widowed    Spouse name: Not on file   Number of children: Not on file   Years of education: Not on file   Highest education level: Not on file  Occupational History   Not on file  Tobacco Use   Smoking status: Never   Smokeless tobacco: Never  Substance and Sexual Activity   Alcohol use: No   Drug use: No   Sexual activity: Not on file  Other Topics Concern   Not on file  Social History Narrative   Not on file   Social Determinants of Health   Financial  Resource Strain: Not on file  Food Insecurity: Not on file  Transportation Needs: Not on file  Physical Activity: Not on file  Stress: Not on file  Social Connections: Not on file     Family History: Family history includes brother had multiple MIs, first in late 50s/early 38s, and sister has PPM.  ROS:   Please see the history of present illness.     All other systems reviewed and are negative.  EKGs/Labs/Other Studies Reviewed:    The following studies were reviewed today:   EKG:   07/03/2021: Normal sinus rhythm, rate 87, less than 1 mm ST depressions in leads II, aVF,  V5-6  Recent Labs: 01/01/2021: BUN 8; Creatinine, Ser 0.76; Potassium 4.0; Sodium 138   Recent Lipid Panel    Component Value Date/Time   CHOL 134 01/01/2021 0830   TRIG 101 01/01/2021 0830   HDL 36 (L) 01/01/2021 0830   CHOLHDL 3.7 01/01/2021 0830   LDLCALC 79 01/01/2021 0830    Physical Exam:    VS:  BP 132/74    Pulse 87    Ht 5\' 6"  (1.676 m)    Wt 154 lb 3.2 oz (69.9 kg)    SpO2 99%    BMI 24.89 kg/m     Wt Readings from Last 3 Encounters:  07/03/21 154 lb 3.2 oz (69.9 kg)  12/30/20 153 lb 9.6 oz (69.7 kg)  07/12/15 151 lb (68.5 kg)     GEN:  Well nourished, well developed in no acute distress HEENT: Normal NECK: No JVD; No carotid bruits LYMPHATICS: No lymphadenopathy CARDIAC: RRR, no murmurs, rubs, gallops RESPIRATORY:  Clear to auscultation without rales, wheezing or rhonchi  ABDOMEN: Soft, non-tender, non-distended MUSCULOSKELETAL:  No edema; No deformity  SKIN: Warm and dry NEUROLOGIC:  Alert and oriented x 3 PSYCHIATRIC:  Normal affect   ASSESSMENT:    1. Chest pain, unspecified type   2. Essential hypertension   3. Hyperlipidemia, unspecified hyperlipidemia type   4. Coronary artery disease involving native coronary artery of native heart without angina pectoris   5. DOE (dyspnea on exertion)     PLAN:    Chest pain/dyspnea on exertion: Description concerning for angina, as describes burning chest pain that occurs with exertion and relieved with rest.  Echocardiogram on 01/15/2021 showed normal biventricular function, no significant valvular disease.  Coronary CTA on 01/08/2021 showed calcium score 311 (89th percentile), nonobstructive CAD. -Given description suggestive of angina with nonobstructive disease on coronary CT, could be microvascular disease.  Will trial metoprolol 25 mg twice daily to see if helps with anginal symptoms  CAD: Coronary CTA on 01/08/2021 showed calcium score 311 (89th percentile), nonobstructive CAD. -Continue aspirin 81 mg daily -Continue rosuvastatin 20 mg daily.  Check lipid panel  Hypertension: On amlodipine 10  mg daily and losartan 100 mg daily.  Mildly elevated in clinic today but reports has been under control at home.  We will add metoprolol as above  Hyperlipidemia: On rosuvastatin 20 mg every other day, LDL 79 12/2020.  Rosuvastatin increased to 20 mg daily.  Will recheck lipid panel  T2DM: On metformin.  Hemoglobin A1c 7.1% on 01/23/21.  RTC in 6 months   Medication Adjustments/Labs and Tests Ordered: Current medicines are reviewed at length with the patient today.  Concerns regarding medicines are outlined above.  Orders Placed This Encounter  Procedures   Lipid panel   EKG 12-Lead    Meds ordered this encounter  Medications   metoprolol tartrate (LOPRESSOR) 25  MG tablet    Sig: Take 1 tablet (25 mg total) by mouth 2 (two) times daily.    Dispense:  180 tablet    Refill:  3     Patient Instructions  Medication Instructions:  Start Metoprolol Tartrate 25 mg twice a day Continue all other medications *If you need a refill on your cardiac medications before your next appointment, please call your pharmacy*   Lab Work: Lipid panel today   Testing/Procedures: None ordered   Follow-Up: At [facility], you and your health needs are our priority.  As part of our continuing mission to provide you with exceptional heart care, we have created designated Provider Care Teams.  These Care Teams include your primary Cardiologist (physician) and Advanced Practice Providers (APPs -  Physician Assistants and Nurse Practitioners) who all work together to provide you with the care you need, when you need it.  We recommend signing up for the patient portal called "MyChart".  Sign up information is provided on this After Visit Summary.  MyChart is used to connect with patients for Virtual Visits (Telemedicine).  Patients are able to view lab/test results, encounter notes, upcoming appointments, etc.  Non-urgent messages can be sent to your provider as well.   To learn more about what you can  do with MyChart, go to [email or web address].    Your next appointment:  6 months    The format for your next appointment:  Office   Provider:  Dr.Bronx Brogden     Signed, Donato Heinz, MD  07/03/2021 9:15 AM    [place]

## 2021-07-03 ENCOUNTER — Ambulatory Visit: Payer: HMO | Admitting: Cardiology

## 2021-07-03 ENCOUNTER — Encounter: Payer: Self-pay | Admitting: Cardiology

## 2021-07-03 ENCOUNTER — Other Ambulatory Visit: Payer: Self-pay

## 2021-07-03 VITALS — BP 132/74 | HR 87 | Ht 66.0 in | Wt 154.2 lb

## 2021-07-03 DIAGNOSIS — R079 Chest pain, unspecified: Secondary | ICD-10-CM

## 2021-07-03 DIAGNOSIS — E785 Hyperlipidemia, unspecified: Secondary | ICD-10-CM | POA: Diagnosis not present

## 2021-07-03 DIAGNOSIS — R0609 Other forms of dyspnea: Secondary | ICD-10-CM | POA: Diagnosis not present

## 2021-07-03 DIAGNOSIS — I2510 Atherosclerotic heart disease of native coronary artery without angina pectoris: Secondary | ICD-10-CM

## 2021-07-03 DIAGNOSIS — I10 Essential (primary) hypertension: Secondary | ICD-10-CM | POA: Diagnosis not present

## 2021-07-03 LAB — LIPID PANEL
Chol/HDL Ratio: 3.5 ratio (ref 0.0–4.4)
Cholesterol, Total: 122 mg/dL (ref 100–199)
HDL: 35 mg/dL — ABNORMAL LOW (ref >39)
LDL Chol Calc (NIH): 69 mg/dL (ref 0–99)
Triglycerides: 95 mg/dL (ref 0–149)
VLDL Cholesterol Cal: 18 mg/dL (ref 5–40)

## 2021-07-03 MED ORDER — METOPROLOL TARTRATE 25 MG PO TABS: 25.0000 mg | ORAL_TABLET | Freq: Two times a day (BID) | ORAL | 3 refills | Status: DC

## 2021-07-03 NOTE — Patient Instructions (Signed)
Medication Instructions:  Start Metoprolol Tartrate 25 mg twice a day Continue all other medications *If you need a refill on your cardiac medications before your next appointment, please call your pharmacy*   Lab Work: Lipid panel today   Testing/Procedures: None ordered   Follow-Up: At [facility], you and your health needs are our priority.  As part of our continuing mission to provide you with exceptional heart care, we have created designated Provider Care Teams.  These Care Teams include your primary Cardiologist (physician) and Advanced Practice Providers (APPs -  Physician Assistants and Nurse Practitioners) who all work together to provide you with the care you need, when you need it.  We recommend signing up for the patient portal called "MyChart".  Sign up information is provided on this After Visit Summary.  MyChart is used to connect with patients for Virtual Visits (Telemedicine).  Patients are able to view lab/test results, encounter notes, upcoming appointments, etc.  Non-urgent messages can be sent to your provider as well.   To learn more about what you can do with MyChart, go to [email or web address].    Your next appointment:  6 months    The format for your next appointment:  Office   Provider:  Dr.Schumann

## 2021-07-22 DIAGNOSIS — Z7984 Long term (current) use of oral hypoglycemic drugs: Secondary | ICD-10-CM | POA: Diagnosis not present

## 2021-07-22 DIAGNOSIS — E785 Hyperlipidemia, unspecified: Secondary | ICD-10-CM | POA: Diagnosis not present

## 2021-07-22 DIAGNOSIS — E039 Hypothyroidism, unspecified: Secondary | ICD-10-CM | POA: Diagnosis not present

## 2021-07-22 DIAGNOSIS — Z0000 Encounter for general adult medical examination without abnormal findings: Secondary | ICD-10-CM | POA: Diagnosis not present

## 2021-07-22 DIAGNOSIS — E538 Deficiency of other specified B group vitamins: Secondary | ICD-10-CM | POA: Diagnosis not present

## 2021-07-22 DIAGNOSIS — E1169 Type 2 diabetes mellitus with other specified complication: Secondary | ICD-10-CM | POA: Diagnosis not present

## 2021-07-22 DIAGNOSIS — E559 Vitamin D deficiency, unspecified: Secondary | ICD-10-CM | POA: Diagnosis not present

## 2021-07-22 DIAGNOSIS — D649 Anemia, unspecified: Secondary | ICD-10-CM | POA: Diagnosis not present

## 2021-07-24 DIAGNOSIS — I700 Atherosclerosis of aorta: Secondary | ICD-10-CM | POA: Diagnosis not present

## 2021-07-24 DIAGNOSIS — Z23 Encounter for immunization: Secondary | ICD-10-CM | POA: Diagnosis not present

## 2021-07-24 DIAGNOSIS — E785 Hyperlipidemia, unspecified: Secondary | ICD-10-CM | POA: Diagnosis not present

## 2021-07-24 DIAGNOSIS — E039 Hypothyroidism, unspecified: Secondary | ICD-10-CM | POA: Diagnosis not present

## 2021-07-24 DIAGNOSIS — Z0000 Encounter for general adult medical examination without abnormal findings: Secondary | ICD-10-CM | POA: Diagnosis not present

## 2021-07-24 DIAGNOSIS — I10 Essential (primary) hypertension: Secondary | ICD-10-CM | POA: Diagnosis not present

## 2021-07-24 DIAGNOSIS — E1169 Type 2 diabetes mellitus with other specified complication: Secondary | ICD-10-CM | POA: Diagnosis not present

## 2021-07-24 DIAGNOSIS — D649 Anemia, unspecified: Secondary | ICD-10-CM | POA: Diagnosis not present

## 2021-08-03 DIAGNOSIS — I700 Atherosclerosis of aorta: Secondary | ICD-10-CM

## 2021-08-03 DIAGNOSIS — K5904 Chronic idiopathic constipation: Secondary | ICD-10-CM | POA: Diagnosis not present

## 2021-08-03 DIAGNOSIS — Z86010 Personal history of colonic polyps: Secondary | ICD-10-CM | POA: Diagnosis not present

## 2021-08-03 HISTORY — DX: Atherosclerosis of aorta: I70.0

## 2021-09-04 DIAGNOSIS — Z1211 Encounter for screening for malignant neoplasm of colon: Secondary | ICD-10-CM | POA: Diagnosis not present

## 2021-09-04 DIAGNOSIS — K648 Other hemorrhoids: Secondary | ICD-10-CM | POA: Diagnosis not present

## 2021-09-04 DIAGNOSIS — Z86010 Personal history of colonic polyps: Secondary | ICD-10-CM | POA: Diagnosis not present

## 2021-09-22 DIAGNOSIS — F419 Anxiety disorder, unspecified: Secondary | ICD-10-CM | POA: Diagnosis not present

## 2021-09-22 DIAGNOSIS — I25118 Atherosclerotic heart disease of native coronary artery with other forms of angina pectoris: Secondary | ICD-10-CM | POA: Diagnosis not present

## 2021-09-22 DIAGNOSIS — E1169 Type 2 diabetes mellitus with other specified complication: Secondary | ICD-10-CM | POA: Diagnosis not present

## 2021-09-22 DIAGNOSIS — I25119 Atherosclerotic heart disease of native coronary artery with unspecified angina pectoris: Secondary | ICD-10-CM | POA: Diagnosis not present

## 2021-09-22 DIAGNOSIS — E785 Hyperlipidemia, unspecified: Secondary | ICD-10-CM | POA: Diagnosis not present

## 2021-09-22 DIAGNOSIS — D509 Iron deficiency anemia, unspecified: Secondary | ICD-10-CM | POA: Diagnosis not present

## 2021-09-22 DIAGNOSIS — E538 Deficiency of other specified B group vitamins: Secondary | ICD-10-CM | POA: Diagnosis not present

## 2021-09-22 DIAGNOSIS — G4700 Insomnia, unspecified: Secondary | ICD-10-CM | POA: Diagnosis not present

## 2021-09-22 DIAGNOSIS — E039 Hypothyroidism, unspecified: Secondary | ICD-10-CM | POA: Diagnosis not present

## 2021-09-22 DIAGNOSIS — E1136 Type 2 diabetes mellitus with diabetic cataract: Secondary | ICD-10-CM | POA: Diagnosis not present

## 2021-09-22 DIAGNOSIS — E559 Vitamin D deficiency, unspecified: Secondary | ICD-10-CM | POA: Diagnosis not present

## 2021-09-22 DIAGNOSIS — E1142 Type 2 diabetes mellitus with diabetic polyneuropathy: Secondary | ICD-10-CM | POA: Diagnosis not present

## 2021-10-08 ENCOUNTER — Other Ambulatory Visit: Payer: Self-pay | Admitting: Cardiology

## 2021-10-30 DIAGNOSIS — E785 Hyperlipidemia, unspecified: Secondary | ICD-10-CM | POA: Diagnosis not present

## 2021-10-30 DIAGNOSIS — M25511 Pain in right shoulder: Secondary | ICD-10-CM | POA: Diagnosis not present

## 2021-10-30 DIAGNOSIS — I700 Atherosclerosis of aorta: Secondary | ICD-10-CM | POA: Diagnosis not present

## 2021-10-30 DIAGNOSIS — E1169 Type 2 diabetes mellitus with other specified complication: Secondary | ICD-10-CM | POA: Diagnosis not present

## 2021-10-30 DIAGNOSIS — D649 Anemia, unspecified: Secondary | ICD-10-CM | POA: Diagnosis not present

## 2021-10-30 DIAGNOSIS — E039 Hypothyroidism, unspecified: Secondary | ICD-10-CM | POA: Diagnosis not present

## 2021-10-30 DIAGNOSIS — Z23 Encounter for immunization: Secondary | ICD-10-CM | POA: Diagnosis not present

## 2021-10-30 DIAGNOSIS — I10 Essential (primary) hypertension: Secondary | ICD-10-CM | POA: Diagnosis not present

## 2021-11-03 ENCOUNTER — Ambulatory Visit
Admission: RE | Admit: 2021-11-03 | Discharge: 2021-11-03 | Disposition: A | Discharge status: Home / Self Care | Payer: HMO | Source: Ambulatory Visit | Attending: Sports Medicine | Admitting: Sports Medicine

## 2021-11-03 ENCOUNTER — Other Ambulatory Visit: Payer: Self-pay | Admitting: Sports Medicine

## 2021-11-03 DIAGNOSIS — M25511 Pain in right shoulder: Secondary | ICD-10-CM | POA: Diagnosis not present

## 2021-11-11 DIAGNOSIS — M25511 Pain in right shoulder: Secondary | ICD-10-CM | POA: Diagnosis not present

## 2021-11-19 DIAGNOSIS — M25511 Pain in right shoulder: Secondary | ICD-10-CM | POA: Diagnosis not present

## 2021-11-23 DIAGNOSIS — M25511 Pain in right shoulder: Secondary | ICD-10-CM | POA: Diagnosis not present

## 2021-11-30 DIAGNOSIS — M25511 Pain in right shoulder: Secondary | ICD-10-CM | POA: Diagnosis not present

## 2021-12-16 DIAGNOSIS — E039 Hypothyroidism, unspecified: Secondary | ICD-10-CM | POA: Diagnosis not present

## 2022-01-11 NOTE — Progress Notes (Signed)
Cardiology Office Note:    Date:  01/12/2022   ID:  Margaret Long, DOB 13-Dec-1950, MRN [id number]  PCP:  London Pepper, MD  Cardiologist:  None  Electrophysiologist:  None   Referring MD: London Pepper, MD   Chief Complaint  Patient presents with   Follow-up   Coronary Artery Disease    History of Present Illness:    Margaret Long is a 71 y.o. female with a hx of T2DM, hypertension, hyperlipidemia, hypothyroidism, former tobacco use who presents for follow-up.  She was referred by Dr. Justin Mend for evaluation of dyspnea on exertion, initially seen on 12/30/2020.  She reported that she has been having chest pain and dyspnea with minimal exertion.  Reports reliably has symptoms when she walks up stairs.  Describes burning in center of her chest and shortness of breath that resolves within 1 to 2 minutes of rest.  Feels lightheaded during episode, denies any syncope.  Denies any palpitations or lower extremity edema.  She smoked almost 1 pack/day for 36 years, quit in 2005.  Family history includes brother had multiple MIs, first in late 50s/early 5s, and sister has PPM.  Echocardiogram on 01/15/2021 showed normal biventricular function, no significant valvular disease.  Coronary CTA on 01/08/2021 showed calcium score 311 (89th percentile), nonobstructive CAD.  Since last clinic visit, she reports that she is doing well.  States that her chest pain has significantly improved since starting metoprolol.  Denies any dyspnea, lower extremity edema, or palpitations.  Reports some lightheadedness but denies any syncope.  Past Medical History:  Diagnosis Date   Diabetes mellitus without complication (HCC)    High cholesterol    Thyroid disease     No past surgical history on file.  Current Medications: Current Meds  Medication Sig   amLODipine (NORVASC) 10 MG tablet Take 10 mg by mouth daily.   aspirin 81 MG EC tablet Take 1 tablet by mouth daily.   Cholecalciferol 25 MCG (1000 UT) capsule Take  1,000 Units by mouth daily.   cyanocobalamin 1000 MCG tablet Take 1 tablet by mouth daily.   famotidine (PEPCID) 20 MG tablet Take by mouth.   levothyroxine (SYNTHROID) 75 MCG tablet Take 1 tablet by mouth daily.   losartan (COZAAR) 100 MG tablet 1 tablet daily.   metFORMIN (GLUCOPHAGE) 1000 MG tablet 1 tablet 2 (two) times daily with a meal.   metoprolol tartrate (LOPRESSOR) 25 MG tablet Take 1 tablet (25 mg total) by mouth 2 (two) times daily.   rosuvastatin (CRESTOR) 20 MG tablet TAKE 1 TABLET(20 MG) BY MOUTH DAILY   [DISCONTINUED] nitroGLYCERIN (NITROSTAT) 0.4 MG SL tablet Place 1 tablet (0.4 mg total) under the tongue every 5 (five) minutes as needed for chest pain.     Allergies:   Lisinopril   Social History   Socioeconomic History   Marital status: Widowed    Spouse name: Not on file   Number of children: Not on file   Years of education: Not on file   Highest education level: Not on file  Occupational History   Not on file  Tobacco Use   Smoking status: Former    Packs/day: 0.50    Types: Cigarettes    Quit date: 05/2003    Years since quitting: 18.6   Smokeless tobacco: Never  Substance and Sexual Activity   Alcohol use: No   Drug use: No   Sexual activity: Not on file  Other Topics Concern   Not on file  Social History Narrative  Not on file   Social Determinants of Health   Financial Resource Strain: Not on file  Food Insecurity: No Food Insecurity (07/27/2019)   Hunger Vital Sign    Worried About Running Out of Food in the Last Year: Never true    Ran Out of Food in the Last Year: Never true  Transportation Needs: No Transportation Needs (07/27/2019)   PRAPARE - Hydrologist (Medical): No    Lack of Transportation (Non-Medical): No  Physical Activity: Not on file  Stress: Not on file  Social Connections: Not on file     Family History: Family history includes brother had multiple MIs, first in late 50s/early 7s, and sister  has PPM.  ROS:   Please see the history of present illness.     All other systems reviewed and are negative.  EKGs/Labs/Other Studies Reviewed:    The following studies were reviewed today:   EKG:   01/12/2022: Normal sinus rhythm, rate 75, no ST abnormalities 07/03/2021: Normal sinus rhythm, rate 87, less than 1 mm ST depressions in leads II, aVF,  V5-6  Recent Labs: No results found for requested labs within last 365 days.  Recent Lipid Panel    Component Value Date/Time   CHOL 122 07/03/2021 0842   TRIG 95 07/03/2021 0842   HDL 35 (L) 07/03/2021 0842   CHOLHDL 3.5 07/03/2021 0842   LDLCALC 69 07/03/2021 0842    Physical Exam:    VS:  BP 112/74   Pulse 75   Ht '5\' 6"'$  (1.676 m)   Wt 147 lb 6.4 oz (66.9 kg)   BMI 23.79 kg/m     Wt Readings from Last 3 Encounters:  01/12/22 147 lb 6.4 oz (66.9 kg)  07/03/21 154 lb 3.2 oz (69.9 kg)  12/30/20 153 lb 9.6 oz (69.7 kg)     GEN:  Well nourished, well developed in no acute distress HEENT: Normal NECK: No JVD; No carotid bruits LYMPHATICS: No lymphadenopathy CARDIAC: RRR, no murmurs, rubs, gallops RESPIRATORY:  Clear to auscultation without rales, wheezing or rhonchi  ABDOMEN: Soft, non-tender, non-distended MUSCULOSKELETAL:  No edema; No deformity  SKIN: Warm and dry NEUROLOGIC:  Alert and oriented x 3 PSYCHIATRIC:  Normal affect   ASSESSMENT:    1. Chest pain, unspecified type   2. DOE (dyspnea on exertion)   3. Coronary artery disease involving native coronary artery of native heart without angina pectoris   4. Essential hypertension   5. Hyperlipidemia, unspecified hyperlipidemia type      PLAN:    Chest pain/dyspnea on exertion: Description concerning for angina, as describes burning chest pain that occurs with exertion and relieved with rest.  Echocardiogram on 01/15/2021 showed normal biventricular function, no significant valvular disease.  Coronary CTA on 01/08/2021 showed calcium score 311 (89th  percentile), nonobstructive CAD. -Given description suggestive of angina with nonobstructive disease on coronary CT, suspect microvascular disease.  Started on metoprolol 25 mg twice daily with significant improvement in her chest pain, will continue  CAD: Coronary CTA on 01/08/2021 showed calcium score 311 (89th percentile), nonobstructive CAD. -Continue aspirin 81 mg daily -Continue rosuvastatin 20 mg daily.    Hypertension: On amlodipine 10 mg daily and losartan 100 mg daily and metoprolol 25 mg twice daily.  Appears controlled.   Hyperlipidemia: On rosuvastatin 20 mg daily, LDL 76 on 07/22/2021.  Will check lipid panel, goal LDL less than 70.  T2DM: On metformin.  Hemoglobin A1c 6.8% on 07/22/21  RTC in  1 year   Medication Adjustments/Labs and Tests Ordered: Current medicines are reviewed at length with the patient today.  Concerns regarding medicines are outlined above.  Orders Placed This Encounter  Procedures   Lipid panel   EKG 12-Lead    Meds ordered this encounter  Medications   nitroGLYCERIN (NITROSTAT) 0.4 MG SL tablet    Sig: Place 1 tablet (0.4 mg total) under the tongue every 5 (five) minutes as needed for chest pain.    Dispense:  25 tablet    Refill:  2     There are no Patient Instructions on file for this visit.   Signed, Donato Heinz, MD  01/12/2022 8:51 AM    [place]

## 2022-01-12 ENCOUNTER — Encounter: Payer: Self-pay | Admitting: Cardiology

## 2022-01-12 ENCOUNTER — Ambulatory Visit: Payer: HMO | Admitting: Cardiology

## 2022-01-12 VITALS — BP 112/74 | HR 75 | Ht 66.0 in | Wt 147.4 lb

## 2022-01-12 DIAGNOSIS — R0609 Other forms of dyspnea: Secondary | ICD-10-CM | POA: Diagnosis not present

## 2022-01-12 DIAGNOSIS — I10 Essential (primary) hypertension: Secondary | ICD-10-CM

## 2022-01-12 DIAGNOSIS — I2510 Atherosclerotic heart disease of native coronary artery without angina pectoris: Secondary | ICD-10-CM

## 2022-01-12 DIAGNOSIS — R079 Chest pain, unspecified: Secondary | ICD-10-CM | POA: Diagnosis not present

## 2022-01-12 DIAGNOSIS — E785 Hyperlipidemia, unspecified: Secondary | ICD-10-CM

## 2022-01-12 LAB — LIPID PANEL
Chol/HDL Ratio: 3.3 ratio (ref 0.0–4.4)
Cholesterol, Total: 117 mg/dL (ref 100–199)
HDL: 36 mg/dL — ABNORMAL LOW (ref >39)
LDL Chol Calc (NIH): 65 mg/dL (ref 0–99)
Triglycerides: 82 mg/dL (ref 0–149)
VLDL Cholesterol Cal: 16 mg/dL (ref 5–40)

## 2022-01-12 MED ORDER — NITROGLYCERIN 0.4 MG SL SUBL: 0.4000 mg | SUBLINGUAL_TABLET | SUBLINGUAL | 2 refills | Status: DC | PRN

## 2022-01-12 NOTE — Patient Instructions (Signed)
Medication Instructions:  Continue same medications *If you need a refill on your cardiac medications before your next appointment, please call your pharmacy*   Lab Work: Lipid panel today   Testing/Procedures: None ordered   Follow-Up: At [facility], you and your health needs are our priority.  As part of our continuing mission to provide you with exceptional heart care, we have created designated Provider Care Teams.  These Care Teams include your primary Cardiologist (physician) and Advanced Practice Providers (APPs -  Physician Assistants and Nurse Practitioners) who all work together to provide you with the care you need, when you need it.  We recommend signing up for the patient portal called "MyChart".  Sign up information is provided on this After Visit Summary.  MyChart is used to connect with patients for Virtual Visits (Telemedicine).  Patients are able to view lab/test results, encounter notes, upcoming appointments, etc.  Non-urgent messages can be sent to your provider as well.   To learn more about what you can do with MyChart, go to [email or web address].    Your next appointment:  1 year   Call in May to schedule a August appointment     The format for your next appointment: Office   Provider:  Dr.Schumann   Important Information About Sugar

## 2022-02-03 DIAGNOSIS — E039 Hypothyroidism, unspecified: Secondary | ICD-10-CM | POA: Diagnosis not present

## 2022-04-24 ENCOUNTER — Other Ambulatory Visit: Payer: Self-pay | Admitting: Cardiology

## 2022-06-16 DIAGNOSIS — E119 Type 2 diabetes mellitus without complications: Secondary | ICD-10-CM | POA: Diagnosis not present

## 2022-07-16 DIAGNOSIS — I10 Essential (primary) hypertension: Secondary | ICD-10-CM | POA: Diagnosis not present

## 2022-07-16 DIAGNOSIS — R634 Abnormal weight loss: Secondary | ICD-10-CM | POA: Diagnosis not present

## 2022-07-16 DIAGNOSIS — E119 Type 2 diabetes mellitus without complications: Secondary | ICD-10-CM | POA: Diagnosis not present

## 2022-07-16 DIAGNOSIS — E785 Hyperlipidemia, unspecified: Secondary | ICD-10-CM | POA: Diagnosis not present

## 2022-07-16 DIAGNOSIS — E1169 Type 2 diabetes mellitus with other specified complication: Secondary | ICD-10-CM | POA: Diagnosis not present

## 2022-07-16 DIAGNOSIS — R441 Visual hallucinations: Secondary | ICD-10-CM | POA: Diagnosis not present

## 2022-07-16 DIAGNOSIS — E538 Deficiency of other specified B group vitamins: Secondary | ICD-10-CM | POA: Diagnosis not present

## 2022-07-16 DIAGNOSIS — E039 Hypothyroidism, unspecified: Secondary | ICD-10-CM | POA: Diagnosis not present

## 2022-07-16 DIAGNOSIS — E559 Vitamin D deficiency, unspecified: Secondary | ICD-10-CM | POA: Diagnosis not present

## 2022-08-10 DIAGNOSIS — E039 Hypothyroidism, unspecified: Secondary | ICD-10-CM | POA: Diagnosis not present

## 2022-08-10 DIAGNOSIS — E785 Hyperlipidemia, unspecified: Secondary | ICD-10-CM | POA: Diagnosis not present

## 2022-08-10 DIAGNOSIS — R809 Proteinuria, unspecified: Secondary | ICD-10-CM | POA: Diagnosis not present

## 2022-08-10 DIAGNOSIS — I10 Essential (primary) hypertension: Secondary | ICD-10-CM | POA: Diagnosis not present

## 2022-08-10 DIAGNOSIS — H539 Unspecified visual disturbance: Secondary | ICD-10-CM | POA: Diagnosis not present

## 2022-08-10 DIAGNOSIS — Z23 Encounter for immunization: Secondary | ICD-10-CM | POA: Diagnosis not present

## 2022-08-10 DIAGNOSIS — E119 Type 2 diabetes mellitus without complications: Secondary | ICD-10-CM | POA: Diagnosis not present

## 2022-08-10 DIAGNOSIS — D649 Anemia, unspecified: Secondary | ICD-10-CM | POA: Diagnosis not present

## 2022-08-10 DIAGNOSIS — I700 Atherosclerosis of aorta: Secondary | ICD-10-CM | POA: Diagnosis not present

## 2022-08-10 DIAGNOSIS — R441 Visual hallucinations: Secondary | ICD-10-CM | POA: Diagnosis not present

## 2022-08-10 DIAGNOSIS — Z0000 Encounter for general adult medical examination without abnormal findings: Secondary | ICD-10-CM | POA: Diagnosis not present

## 2022-08-16 ENCOUNTER — Encounter: Payer: Self-pay | Admitting: Physician Assistant

## 2022-08-18 ENCOUNTER — Other Ambulatory Visit: Payer: Self-pay | Admitting: Family Medicine

## 2022-08-18 DIAGNOSIS — R441 Visual hallucinations: Secondary | ICD-10-CM

## 2022-09-06 ENCOUNTER — Ambulatory Visit: Payer: HMO | Admitting: Physician Assistant

## 2022-09-06 ENCOUNTER — Ambulatory Visit: Payer: HMO

## 2022-09-06 DIAGNOSIS — D649 Anemia, unspecified: Secondary | ICD-10-CM | POA: Diagnosis not present

## 2022-09-17 ENCOUNTER — Ambulatory Visit
Admission: RE | Admit: 2022-09-17 | Discharge: 2022-09-17 | Disposition: A | Discharge status: Home / Self Care | Payer: PPO | Source: Ambulatory Visit | Attending: Family Medicine | Admitting: Family Medicine

## 2022-09-17 ENCOUNTER — Ambulatory Visit: Payer: HMO

## 2022-09-17 ENCOUNTER — Ambulatory Visit: Payer: HMO | Admitting: Physician Assistant

## 2022-09-17 DIAGNOSIS — R441 Visual hallucinations: Secondary | ICD-10-CM

## 2022-09-17 DIAGNOSIS — R519 Headache, unspecified: Secondary | ICD-10-CM | POA: Diagnosis not present

## 2022-09-17 MED ORDER — GADOPICLENOL 0.5 MMOL/ML IV SOLN
6.0000 mL | Freq: Once | INTRAVENOUS | Status: AC | PRN
  Administered 2022-09-17: 6 mL via INTRAVENOUS

## 2022-09-24 ENCOUNTER — Encounter: Payer: PPO | Admitting: Physician Assistant

## 2022-09-29 DIAGNOSIS — R809 Proteinuria, unspecified: Secondary | ICD-10-CM | POA: Diagnosis not present

## 2022-09-29 DIAGNOSIS — E1122 Type 2 diabetes mellitus with diabetic chronic kidney disease: Secondary | ICD-10-CM | POA: Diagnosis not present

## 2022-09-29 DIAGNOSIS — N179 Acute kidney failure, unspecified: Secondary | ICD-10-CM | POA: Diagnosis not present

## 2022-09-29 DIAGNOSIS — I10 Essential (primary) hypertension: Secondary | ICD-10-CM | POA: Diagnosis not present

## 2022-10-10 ENCOUNTER — Other Ambulatory Visit: Payer: Self-pay | Admitting: Cardiology

## 2022-10-22 ENCOUNTER — Ambulatory Visit: Payer: PPO | Admitting: Physician Assistant

## 2022-10-22 ENCOUNTER — Encounter: Payer: Self-pay | Admitting: Physician Assistant

## 2022-10-22 ENCOUNTER — Encounter: Payer: Self-pay | Admitting: Psychology

## 2022-10-22 VITALS — BP 114/60 | HR 88 | Resp 20 | Ht 66.0 in | Wt 135.0 lb

## 2022-10-22 DIAGNOSIS — R413 Other amnesia: Secondary | ICD-10-CM | POA: Diagnosis not present

## 2022-10-22 DIAGNOSIS — R441 Visual hallucinations: Secondary | ICD-10-CM | POA: Diagnosis not present

## 2022-10-22 DIAGNOSIS — F419 Anxiety disorder, unspecified: Secondary | ICD-10-CM

## 2022-10-22 HISTORY — DX: Visual hallucinations: R44.1
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
## 2022-10-22 NOTE — Patient Instructions (Addendum)
It was a pleasure to see you today at our office.   Recommendations:  Neurocognitive evaluation at our office  Check labs today  Referral to [facility] eye doctor  Referral to Behavioral health  EEG  Follow up after the neurocognitive testing   For guidance regarding [facility] and if placement were needed at the facility, contact Sidney Ace, Social Worker tel: [phone]  For assessment of decision of mental capacity and competency:  Call Dr. Erick Blinks, geriatric psychiatrist at [phone] Counseling regarding caregiver distress, including caregiver depression, anxiety and issues regarding community resources, adult day care programs, adult living facilities, or memory care questions:  please contact your  Primary Doctor's Social Worker  Whom to call: Memory  decline, memory medications: Call our office [phone]  For psychiatric meds, mood meds: Please have your primary care physician manage these medications.  If you have any severe symptoms of a stroke, or other severe issues such as confusion,severe chills or fever, etc call 911 or go to the ER as you may need to be evaluated further    RECOMMENDATIONS FOR ALL PATIENTS WITH MEMORY PROBLEMS: 1. Continue to exercise (Recommend 30 minutes of walking everyday, or 3 hours every week) 2. Increase social interactions - continue going to [place] and enjoy social gatherings with friends and family 3. Eat healthy, avoid fried foods and eat more fruits and vegetables 4. Maintain adequate blood pressure, blood sugar, and blood cholesterol level. Reducing the risk of stroke and cardiovascular disease also helps promoting better memory. 5. Avoid stressful situations. Live a simple life and avoid aggravations. Organize your time and prepare for the next day in anticipation. 6. Sleep well, avoid any interruptions of sleep and avoid any distractions in the bedroom that may interfere with adequate sleep quality 7. Avoid  sugar, avoid sweets as there is a strong link between excessive sugar intake, diabetes, and cognitive impairment We discussed the Mediterranean diet, which has been shown to help patients reduce the risk of progressive memory disorders and reduces cardiovascular risk. This includes eating fish, eat fruits and green leafy vegetables, nuts like almonds and hazelnuts, walnuts, and also use olive oil. Avoid fast foods and fried foods as much as possible. Avoid sweets and sugar as sugar use has been linked to worsening of memory function.  There is always a concern of gradual progression of memory problems. If this is the case, then we may need to adjust level of care according to patient needs. Support, both to the patient and caregiver, should then be put into place.      You have been referred for a neuropsychological evaluation (i.e., evaluation of memory and thinking abilities). Please bring someone with you to this appointment if possible, as it is helpful for the doctor to hear from both you and another adult who knows you well. Please bring eyeglasses and hearing aids if you wear them.    The evaluation will take approximately 3 hours and has two parts:   The first part is a clinical interview with the neuropsychologist (Dr. Milbert Coulter or Dr. Roseanne Reno). During the interview, the neuropsychologist will speak with you and the individual you brought to the appointment.    The second part of the evaluation is testing with the doctor's technician Annabelle Harman or Selena Batten). During the testing, the technician will ask you to remember different types of material, solve problems, and answer some questionnaires. Your family member will not be present for this portion of the evaluation.   Please  note: We must reserve several hours of the neuropsychologist's time and the psychometrician's time for your evaluation appointment. As such, there is a No-Show fee of $100. If you are unable to attend any of your appointments, please  contact our office as soon as possible to reschedule.    FALL PRECAUTIONS: Be cautious when walking. Scan the area for obstacles that may increase the risk of trips and falls. When getting up in the mornings, sit up at the edge of the bed for a few minutes before getting out of bed. Consider elevating the bed at the head end to avoid drop of blood pressure when getting up. Walk always in a well-lit room (use night lights in the walls). Avoid area rugs or power cords from appliances in the middle of the walkways. Use a walker or a cane if necessary and consider physical therapy for balance exercise. Get your eyesight checked regularly.  FINANCIAL OVERSIGHT: Supervision, especially oversight when making financial decisions or transactions is also recommended.  HOME SAFETY: Consider the safety of the kitchen when operating appliances like stoves, microwave oven, and blender. Consider having supervision and share cooking responsibilities until no longer able to participate in those. Accidents with firearms and other hazards in the house should be identified and addressed as well.   ABILITY TO BE LEFT ALONE: If patient is unable to contact 911 operator, consider using LifeLine, or when the need is there, arrange for someone to stay with patients. Smoking is a fire hazard, consider supervision or cessation. Risk of wandering should be assessed by caregiver and if detected at any point, supervision and safe proof recommendations should be instituted.  MEDICATION SUPERVISION: Inability to self-administer medication needs to be constantly addressed. Implement a mechanism to ensure safe administration of the medications.   DRIVING: Regarding driving, in patients with progressive memory problems, driving will be impaired. We advise to have someone else do the driving if trouble finding directions or if minor accidents are reported. Independent driving assessment is available to determine safety of  driving.   If you are interested in the driving assessment, you can contact the following:  [facility] in [place] [phone]  [facility] [phone]  [facility] [phone] or [phone]    Mediterranean Diet A Mediterranean diet refers to food and lifestyle choices that are based on the traditions of countries located on the [employer] [place]. This way of eating has been shown to help prevent certain conditions and improve outcomes for people who have chronic diseases, like kidney disease and heart disease. What are tips for following this plan? Lifestyle  Cook and eat meals together with your family, when possible. Drink enough fluid to keep your urine clear or pale yellow. Be physically active every day. This includes: Aerobic exercise like running or swimming. Leisure activities like gardening, walking, or housework. Get 7-8 hours of sleep each night. If recommended by your health care provider, drink red wine in moderation. This means 1 glass a day for nonpregnant women and 2 glasses a day for men. A glass of wine equals 5 oz (150 mL). Reading food labels  Check the serving size of packaged foods. For foods such as rice and pasta, the serving size refers to the amount of cooked product, not dry. Check the total fat in packaged foods. Avoid foods that have saturated fat or trans fats. Check the ingredients list for added sugars, such as corn syrup. Shopping  At [employer],  buy most of your food from the areas near the walls of the store. This includes: Fresh fruits and vegetables (produce). Grains, beans, nuts, and seeds. Some of these may be available in unpackaged forms or large amounts (in bulk). Fresh seafood. Poultry and eggs. Low-fat dairy products. Buy whole ingredients instead of prepackaged foods. Buy fresh fruits and vegetables in-season from local farmers markets. Buy  frozen fruits and vegetables in resealable bags. If you do not have access to quality fresh seafood, buy precooked frozen shrimp or canned fish, such as tuna, salmon, or sardines. Buy small amounts of raw or cooked vegetables, salads, or olives from the deli or salad bar at your store. Stock your pantry so you always have certain foods on hand, such as olive oil, canned tuna, canned tomatoes, rice, pasta, and beans. Cooking  Cook foods with extra-virgin olive oil instead of using butter or other vegetable oils. Have meat as a side dish, and have vegetables or grains as your main dish. This means having meat in small portions or adding small amounts of meat to foods like pasta or stew. Use beans or vegetables instead of meat in common dishes like chili or lasagna. Experiment with different cooking methods. Try roasting or broiling vegetables instead of steaming or sauteing them. Add frozen vegetables to soups, stews, pasta, or rice. Add nuts or seeds for added healthy fat at each meal. You can add these to yogurt, salads, or vegetable dishes. Marinate fish or vegetables using olive oil, lemon juice, garlic, and fresh herbs. Meal planning  Plan to eat 1 vegetarian meal one day each week. Try to work up to 2 vegetarian meals, if possible. Eat seafood 2 or more times a week. Have healthy snacks readily available, such as: Vegetable sticks with hummus. Greek yogurt. Fruit and nut trail mix. Eat balanced meals throughout the week. This includes: Fruit: 2-3 servings a day Vegetables: 4-5 servings a day Low-fat dairy: 2 servings a day Fish, poultry, or lean meat: 1 serving a day Beans and legumes: 2 or more servings a week Nuts and seeds: 1-2 servings a day Whole grains: 6-8 servings a day Extra-virgin olive oil: 3-4 servings a day Limit red meat and sweets to only a few servings a month What are my food choices? Mediterranean diet Recommended Grains: Whole-grain pasta. Brown rice. Bulgar  wheat. Polenta. Couscous. Whole-wheat bread. Orpah Cobb. Vegetables: Artichokes. Beets. Broccoli. Cabbage. Carrots. Eggplant. Green beans. Chard. Kale. Spinach. Onions. Leeks. Peas. Squash. Tomatoes. Peppers. Radishes. Fruits: Apples. Apricots. Avocado. Berries. Bananas. Cherries. Dates. Figs. Grapes. Lemons. Melon. Oranges. Peaches. Plums. Pomegranate. Meats and other protein foods: Beans. Almonds. Sunflower seeds. Pine nuts. Peanuts. Cod. Salmon. Scallops. Shrimp. Tuna. Tilapia. Clams. Oysters. Eggs. Dairy: Low-fat milk. Cheese. Greek yogurt. Beverages: Water. Red wine. Herbal tea. Fats and oils: Extra virgin olive oil. Avocado oil. Grape seed oil. Sweets and desserts: [place] yogurt with honey. Baked apples. Poached pears. Trail mix. Seasoning and other foods: Basil. Cilantro. Coriander. Cumin. Mint. Parsley. Sage. Rosemary. Tarragon. Garlic. Oregano. Thyme. Pepper. Balsalmic vinegar. Tahini. Hummus. Tomato sauce. Olives. Mushrooms. Limit these Grains: Prepackaged pasta or rice dishes. Prepackaged cereal with added sugar. Vegetables: Deep fried potatoes (french fries). Fruits: Fruit canned in syrup. Meats and other protein foods: Beef. Pork. Lamb. Poultry with skin. Hot dogs. Tomasa Blase. Dairy: Ice cream. Sour cream. Whole milk. Beverages: Juice. Sugar-sweetened soft drinks. Beer. Liquor and spirits. Fats and oils: Butter. Canola oil. Vegetable oil. Beef fat (tallow). Lard. Sweets and desserts: Cookies. Cakes. Pies. Candy. Seasoning  and other foods: Mayonnaise. Premade sauces and marinades. The items listed may not be a complete list. Talk with your dietitian about what dietary choices are right for you. Summary The Mediterranean diet includes both food and lifestyle choices. Eat a variety of fresh fruits and vegetables, beans, nuts, seeds, and whole grains. Limit the amount of red meat and sweets that you eat. Talk with your health care provider about whether it is safe for you to drink red  wine in moderation. This means 1 glass a day for nonpregnant women and 2 glasses a day for men. A glass of wine equals 5 oz (150 mL). This information is not intended to replace advice given to you by your health care provider. Make sure you discuss any questions you have with your health care provider. Document Released: 01/01/2016 Document Revised: 02/03/2016 Document Reviewed: 01/01/2016 Elsevier Interactive Patient Education  2017 [employer].

## 2022-10-28 ENCOUNTER — Other Ambulatory Visit: Payer: PPO

## 2022-11-04 ENCOUNTER — Ambulatory Visit (INDEPENDENT_AMBULATORY_CARE_PROVIDER_SITE_OTHER): Payer: PPO | Admitting: Neurology

## 2022-11-04 ENCOUNTER — Other Ambulatory Visit (INDEPENDENT_AMBULATORY_CARE_PROVIDER_SITE_OTHER): Payer: PPO

## 2022-11-04 DIAGNOSIS — F419 Anxiety disorder, unspecified: Secondary | ICD-10-CM | POA: Diagnosis not present

## 2022-11-04 DIAGNOSIS — R441 Visual hallucinations: Secondary | ICD-10-CM

## 2022-11-05 LAB — TSH: TSH: 0.12 u[IU]/mL — ABNORMAL LOW (ref 0.35–5.50)

## 2022-11-05 LAB — VITAMIN B12: Vitamin B-12: 1125 pg/mL — ABNORMAL HIGH (ref 211–911)

## 2022-11-05 NOTE — Progress Notes (Signed)
B12 is above normal, can hold the dose, but thyroid is low, will send the lab to PCP. Thanks

## 2022-11-22 NOTE — Procedures (Signed)
ELECTROENCEPHALOGRAM REPORT  Date of Study: 11/04/2022  Patient's Name: Margaret Long MRN: [id number] Date of Birth: Dec 30, 1950  Referring Provider: Marlowe Kays, PA-C  Clinical History: This is a 72 year old woman with visual hallucinations, memory loss. EEG for classification.  Medications: Norvasc, Aspirin, Lopressor, Crestor, Synthroid, Cozaar  Technical Summary: A multichannel digital 1-hour EEG recording measured by the international 10-20 system with electrodes applied with paste and impedances below 5000 ohms performed in our laboratory with EKG monitoring in an awake and drowsy patient.  Hyperventilation was not performed. Photic stimulation was performed.  The digital EEG was referentially recorded, reformatted, and digitally filtered in a variety of bipolar and referential montages for optimal display.    Description: The patient is awake and drowsy during the recording.  During maximal wakefulness, there is a symmetric, medium voltage 10 Hz posterior dominant rhythm that attenuates with eye opening.  The record is symmetric.  During drowsiness, there is an increase in theta slowing of the background, with shifting asymmetry over the bilateral temporal regions left greater than right. Sleep was not captured. Photic stimulation did not elicit any abnormalities.  There were no epileptiform discharges or electrographic seizures seen.    EKG lead was unremarkable.  Impression: This 1-hour awake and drowsy EEG is within normal limits.  Clinical Correlation: A normal EEG does not exclude a clinical diagnosis of epilepsy.  If further clinical questions remain, prolonged EEG may be helpful.  Clinical correlation is advised.   Patrcia Dolly, M.D.

## 2022-11-22 NOTE — Progress Notes (Signed)
EEG was normal, no seizure activity seen

## 2022-12-15 DIAGNOSIS — D649 Anemia, unspecified: Secondary | ICD-10-CM | POA: Diagnosis not present

## 2022-12-15 DIAGNOSIS — E538 Deficiency of other specified B group vitamins: Secondary | ICD-10-CM | POA: Diagnosis not present

## 2022-12-15 DIAGNOSIS — I10 Essential (primary) hypertension: Secondary | ICD-10-CM | POA: Diagnosis not present

## 2022-12-15 DIAGNOSIS — E119 Type 2 diabetes mellitus without complications: Secondary | ICD-10-CM | POA: Diagnosis not present

## 2022-12-15 DIAGNOSIS — E1169 Type 2 diabetes mellitus with other specified complication: Secondary | ICD-10-CM | POA: Diagnosis not present

## 2022-12-15 DIAGNOSIS — E039 Hypothyroidism, unspecified: Secondary | ICD-10-CM | POA: Diagnosis not present

## 2022-12-15 DIAGNOSIS — R634 Abnormal weight loss: Secondary | ICD-10-CM | POA: Diagnosis not present

## 2023-01-10 NOTE — Progress Notes (Unsigned)
Cardiology Clinic Note   Patient Name: Margaret Long Date of Encounter: 01/13/2023  Primary Care Provider:  Farris Has, MD Primary Cardiologist:  Little Ishikawa, MD  Patient Profile    72 year old female with history of hypertension, hyperlipidemia, type 2 diabetes, hypothyroidism and former tobacco use.  Seen initially by Dr. Gaynelle Arabian for recurrent chest discomfort along with lightheadedness during exertion.  Coronary CTA on 01/08/2021 showed a calcium score of 311 with nonobstructive CAD.  She was started on metoprolol due to suspected microvascular disease, with significant improvement in her symptoms.  Past Medical History    Past Medical History:  Diagnosis Date   Diabetes mellitus without complication (HCC)    High cholesterol    Thyroid disease    No past surgical history on file.  Allergies  Allergies  Allergen Reactions   Lisinopril Cough   Other Other (See Comments)    Other reaction(s): Other (See Comments)  PAIN    History of Present Illness    Mrs. Margaret Long comes today for ongoing assessment and management of hypertension, chest pain, and hyperlipidemia.  Her main complaint is that of dyspnea on exertion and dizziness.  She states that she has to slow herself down in order to avoid shortness of breath.  She is very sedentary currently.  She also states that when she gets out of bed or is up walking around sometimes she has some significant dizziness.  She has to bend over and drop her head for this to pass.  She has been on amlodipine 10 mg daily and this was decreased to 5 mg daily recently but she has not noticed any changes.  She is also having visual hallucinations and she states that they started after beginning metoprolol 2 years ago.  She is being followed by a neuro psychologist with appointment scheduled at the end of the month.  She states she has had significant improvement in chest discomfort with use of metoprolol.  She has lost 20 pounds with  addition of medications for hypothyroidism.  Home Medications    Current Outpatient Medications  Medication Sig Dispense Refill   amLODipine (NORVASC) 5 MG tablet Take 5 mg by mouth daily.     aspirin 81 MG EC tablet Take 1 tablet by mouth daily.     Cholecalciferol 25 MCG (1000 UT) capsule Take 1,000 Units by mouth daily.     cyanocobalamin 1000 MCG tablet Take 1 tablet by mouth daily.     famotidine (PEPCID) 20 MG tablet Take by mouth.     levothyroxine (SYNTHROID) 75 MCG tablet Take 1 tablet by mouth daily.     losartan (COZAAR) 100 MG tablet 1 tablet daily.     metFORMIN (GLUCOPHAGE) 1000 MG tablet 1 tablet 2 (two) times daily with a meal.     metoprolol tartrate (LOPRESSOR) 25 MG tablet TAKE 1 TABLET(25 MG) BY MOUTH TWICE DAILY 180 tablet 3   nitroGLYCERIN (NITROSTAT) 0.4 MG SL tablet Place 1 tablet (0.4 mg total) under the tongue every 5 (five) minutes as needed for chest pain. 25 tablet 2   rosuvastatin (CRESTOR) 20 MG tablet TAKE 1 TABLET(20 MG) BY MOUTH DAILY 90 tablet 0   No current facility-administered medications for this visit.     Family History    No family history on file. She indicated that her mother is deceased. She indicated that her father is deceased.  Social History    Social History   Socioeconomic History   Marital status: Widowed  Spouse name: Not on file   Number of children: 4   Years of education: 51   Highest education level: Not on file  Occupational History   Not on file  Tobacco Use   Smoking status: Former    Current packs/day: 0.00    Types: Cigarettes    Quit date: 05/2003    Years since quitting: 19.6   Smokeless tobacco: Never  Substance and Sexual Activity   Alcohol use: No   Drug use: No   Sexual activity: Not on file  Other Topics Concern   Not on file  Social History Narrative   Right handed   Drinks decaffeine   Lives alone'   2nd floor apartment   retired   International aid/development worker of [employer]  Strain: Not on file  Food Insecurity: No Food Insecurity (07/27/2019)   Hunger Vital Sign    Worried About Running Out of Food in the Last Year: Never true    Ran Out of Food in the Last Year: Never true  Transportation Needs: No Transportation Needs (07/27/2019)   PRAPARE - Administrator, Civil Service (Medical): No    Lack of Transportation (Non-Medical): No  Physical Activity: Not on file  Stress: Not on file  Social Connections: Not on file  Intimate Partner Violence: Not on file     Review of Systems    General:  No chills, fever, night sweats or weight changes.  Cardiovascular:  No chest pain, dyspnea on exertion, edema, orthopnea, palpitations, paroxysmal nocturnal dyspnea. Dermatological: No rash, lesions/masses Respiratory: No cough, positive for dyspnea Urologic: No hematuria, dysuria Abdominal:   No nausea, vomiting, diarrhea, bright red blood per rectum, melena, or hematemesis Neurologic:  No visual changes, wkns, changes in mental status.  Positive for positional dizziness All other systems reviewed and are otherwise negative except as noted above.  EKG Interpretation Date/Time:  Thursday January 13 2023 10:21:00 EDT Ventricular Rate:  69 PR Interval:  132 QRS Duration:  86 QT Interval:  392 QTC Calculation: 420 R Axis:   51  Text Interpretation: Normal sinus rhythm Normal ECG When compared with ECG of 20-Aug-2013 17:56, No significant change was found Confirmed by Joni Reining [phone]) on 01/13/2023 10:47:34 AM    Physical Exam    VS:  BP 100/64   Pulse 75   Ht 5\' 6"  (1.676 m)   Wt 134 lb 12.8 oz (61.1 kg)   SpO2 96%   BMI 21.76 kg/m  , BMI Body mass index is 21.76 kg/m.     GEN: Well nourished, well developed, in no acute distress. HEENT: normal. Neck: Supple, no JVD, carotid bruits, or masses. Cardiac: RRR, no murmurs, rubs, or gallops. No clubbing, cyanosis, edema.  Radials/DP/PT 2+ and equal bilaterally.  Respiratory:  Respirations  regular and unlabored, clear to auscultation bilaterally. GI: Soft, nontender, nondistended, BS + x 4. MS: no deformity or atrophy. Skin: warm and dry, no rash. Neuro:  Strength and sensation are intact. Psych: Normal affect.  EKG Interpretation Date/Time:  Thursday January 13 2023 10:21:00 EDT Ventricular Rate:  69 PR Interval:  132 QRS Duration:  86 QT Interval:  392 QTC Calculation: 420 R Axis:   51  Text Interpretation: Normal sinus rhythm Normal ECG When compared with ECG of 20-Aug-2013 17:56, No significant change was found Confirmed by Joni Reining [phone]) on 01/13/2023 10:47:34 AM   Lab Results  Component Value Date   WBC 4.9 08/20/2013   HGB 12.5 08/20/2013  HCT 37.4 08/20/2013   MCV 85.8 08/20/2013   PLT 280 08/20/2013   Lab Results  Component Value Date   CREATININE 0.76 01/01/2021   BUN 8 01/01/2021   NA 138 01/01/2021   K 4.0 01/01/2021   CL 101 01/01/2021   CO2 21 01/01/2021   Lab Results  Component Value Date   ALT 12 08/20/2013   AST 14 08/20/2013   ALKPHOS 110 08/20/2013   BILITOT 0.3 08/20/2013   Lab Results  Component Value Date   CHOL 117 01/12/2022   HDL 36 (L) 01/12/2022   LDLCALC 65 01/12/2022   TRIG 82 01/12/2022   CHOLHDL 3.3 01/12/2022    No results found for: "HGBA1C"   Review of Prior Studies EKG Interpretation Date/Time:  Thursday January 13 2023 10:21:00 EDT Ventricular Rate:  69 PR Interval:  132 QRS Duration:  86 QT Interval:  392 QTC Calculation: 420 R Axis:   51  Text Interpretation: Normal sinus rhythm Normal ECG When compared with ECG of 20-Aug-2013 17:56, No significant change was found Confirmed by Joni Reining [phone]) on 01/13/2023 10:47:34 AM    Assessment & Plan   1.  Hypertension: She is on amlodipine, metoprolol, and losartan.  Orthostatic blood pressures are completed in the office today.  She was found to be moderately orthostatic with a 12 point drop in systolic blood pressure with position change.   I am going to decrease amlodipine from 5 mg daily to 2.5 mg daily.  She is going to take her blood pressure daily at the same time every day and record this.  Will see her back in a month for further evaluation.  2.  Chest pain: Likely related to microvascular disease on low-dose metoprolol 25 mg daily.  She states she is having hallucinations since starting this medication.  Being followed by neuropsych Dr. Vanessa Kick for further evaluation.  There are rare cases when beta-blockers can cause hallucinations are usually improved eliminated with discontinuation of beta-blockers.  Will await neuropsych recommendations.  3.  Chronic dyspnea on exertion: Self-limiting.  She is very sedentary.  She said she does walk to her mailbox and in the parking lot of her apartment building.  She also has a treadmill at home but does not use it often.  She is encouraged to increase her activity level for increase stamina.           Signed, Bettey Mare. Liborio Nixon, ANP, AACC   01/13/2023 10:48 AM      Office [phone] Fax [phone]  Notice: This dictation was prepared with Dragon dictation along with smaller phrase technology. Any transcriptional errors that result from this process are unintentional and may not be corrected upon review.

## 2023-01-13 ENCOUNTER — Ambulatory Visit: Payer: HMO | Admitting: Cardiology

## 2023-01-13 ENCOUNTER — Ambulatory Visit: Payer: PPO | Attending: Cardiology | Admitting: Adult Health

## 2023-01-13 ENCOUNTER — Encounter: Payer: Self-pay | Admitting: Adult Health

## 2023-01-13 VITALS — BP 100/64 | HR 75 | Ht 66.0 in | Wt 134.8 lb

## 2023-01-13 DIAGNOSIS — R079 Chest pain, unspecified: Secondary | ICD-10-CM | POA: Diagnosis not present

## 2023-01-13 DIAGNOSIS — R0609 Other forms of dyspnea: Secondary | ICD-10-CM

## 2023-01-13 DIAGNOSIS — I952 Hypotension due to drugs: Secondary | ICD-10-CM | POA: Diagnosis not present

## 2023-01-13 MED ORDER — AMLODIPINE BESYLATE 2.5 MG PO TABS: 2.5000 mg | ORAL_TABLET | Freq: Every day | ORAL | 3 refills | Status: DC

## 2023-01-13 MED ORDER — NITROGLYCERIN 0.4 MG SL SUBL: 0.4000 mg | SUBLINGUAL_TABLET | SUBLINGUAL | 2 refills | Status: DC | PRN

## 2023-01-13 NOTE — Patient Instructions (Signed)
Medication Instructions:  DECREASE AMLODIPINE FROM 5MG  DAILY TO 2.5MG  DAILY *If you need a refill on your cardiac medications before your next appointment, please call your pharmacy*   Lab Work: NO LABS If you have labs (blood work) drawn today and your tests are completely normal, you will receive your results only by: MyChart Message (if you have MyChart) OR A paper copy in the mail If you have any lab test that is abnormal or we need to change your treatment, we will call you to review the results.   Testing/Procedures: NO TESTING   Follow-Up: At [facility], you and your health needs are our priority.  As part of our continuing mission to provide you with exceptional heart care, we have created designated Provider Care Teams.  These Care Teams include your primary Cardiologist (physician) and Advanced Practice Providers (APPs -  Physician Assistants and Nurse Practitioners) who all work together to provide you with the care you need, when you need it.  We recommend signing up for the patient portal called "MyChart".  Sign up information is provided on this After Visit Summary.  MyChart is used to connect with patients for Virtual Visits (Telemedicine).  Patients are able to view lab/test results, encounter notes, upcoming appointments, etc.  Non-urgent messages can be sent to your provider as well.   To learn more about what you can do with MyChart, go to [email or web address].    Your next appointment:   1 month(s)  Provider:   Joni Reining, DNP, ANP  Other Instructions TAKE BLOOD PRESSURE DAILY AT THE SAME TIME EACH DAY AND KEEP RECORD OF BLOOD PRESSURES. CALL OFFICE IF BLOOD PRESSURE >140/80 OR <120/60

## 2023-01-16 ENCOUNTER — Other Ambulatory Visit: Payer: Self-pay | Admitting: Cardiology

## 2023-01-20 ENCOUNTER — Encounter: Payer: Self-pay | Admitting: Psychology

## 2023-01-21 ENCOUNTER — Ambulatory Visit: Payer: PPO | Admitting: Psychology

## 2023-01-21 ENCOUNTER — Ambulatory Visit: Payer: PPO

## 2023-01-21 ENCOUNTER — Encounter: Payer: Self-pay | Admitting: Psychology

## 2023-01-21 DIAGNOSIS — G4700 Insomnia, unspecified: Secondary | ICD-10-CM

## 2023-01-21 DIAGNOSIS — R4189 Other symptoms and signs involving cognitive functions and awareness: Secondary | ICD-10-CM

## 2023-01-21 NOTE — Progress Notes (Signed)
   Psychometrician Note   Cognitive testing was administered to [employer] by Shan Levans, B.S. (psychometrist) under the supervision of Dr. Newman Nickels, Ph.D., licensed psychologist on 01/21/2023. Ms. Alpaugh did not appear overtly distressed by the testing session per behavioral observation or responses across self-report questionnaires. Rest breaks were offered.    The battery of tests administered was selected by Dr. Newman Nickels, Ph.D. with consideration to Ms. Deliz's current level of functioning, the nature of her symptoms, emotional and behavioral responses during interview, level of literacy, observed level of motivation/effort, and the nature of the referral question. This battery was communicated to the psychometrist. Communication between Dr. Newman Nickels, Ph.D. and the psychometrist was ongoing throughout the evaluation and Dr. Newman Nickels, Ph.D. was immediately accessible at all times. Dr. Newman Nickels, Ph.D. provided supervision to the psychometrist on the date of this service to the extent necessary to assure the quality of all services provided.    Idell Duquaine will return within approximately 1-2 weeks for an interactive feedback session with Dr. Milbert Coulter at which time her test performances, clinical impressions, and treatment recommendations will be reviewed in detail. Ms. Stamant understands she can contact our office should she require our assistance before this time.  A total of 160 minutes of billable time were spent face-to-face with Ms. Sobolewski by the psychometrist. This includes both test administration and scoring time. Billing for these services is reflected in the clinical report generated by Dr. Newman Nickels, Ph.D.  This note reflects time spent with the psychometrician and does not include test scores or any clinical interpretations made by Dr. Milbert Coulter. The full report will follow in a separate note.
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
## 2023-01-25 ENCOUNTER — Other Ambulatory Visit: Payer: Self-pay | Admitting: Cardiology

## 2023-01-28 ENCOUNTER — Encounter: Payer: Self-pay | Admitting: Physician Assistant

## 2023-01-28 ENCOUNTER — Ambulatory Visit (INDEPENDENT_AMBULATORY_CARE_PROVIDER_SITE_OTHER): Payer: PPO | Admitting: Physician Assistant

## 2023-01-28 VITALS — BP 103/69 | HR 83 | Ht 66.0 in | Wt 132.4 lb

## 2023-01-28 DIAGNOSIS — R4189 Other symptoms and signs involving cognitive functions and awareness: Secondary | ICD-10-CM | POA: Diagnosis not present

## 2023-01-28 NOTE — Patient Instructions (Addendum)
It was a pleasure to see you today at our office.   Recommendations:  Follow up as needed Recommend behavioral health for PTSD and depression     For guidance regarding [facility] and if placement were needed at the facility, contact Sidney Ace, Social Worker tel: [phone]  For assessment of decision of mental capacity and competency:  Call Dr. Erick Blinks, geriatric psychiatrist at [phone] Counseling regarding caregiver distress, including caregiver depression, anxiety and issues regarding community resources, adult day care programs, adult living facilities, or memory care questions:  please contact your  Primary Doctor's Social Worker  Whom to call: Memory  decline, memory medications: Call our office [phone]  For psychiatric meds, mood meds: Please have your primary care physician manage these medications.  If you have any severe symptoms of a stroke, or other severe issues such as confusion,severe chills or fever, etc call 911 or go to the ER as you may need to be evaluated further    RECOMMENDATIONS FOR ALL PATIENTS WITH MEMORY PROBLEMS: 1. Continue to exercise (Recommend 30 minutes of walking everyday, or 3 hours every week) 2. Increase social interactions - continue going to [place] and enjoy social gatherings with friends and family 3. Eat healthy, avoid fried foods and eat more fruits and vegetables 4. Maintain adequate blood pressure, blood sugar, and blood cholesterol level. Reducing the risk of stroke and cardiovascular disease also helps promoting better memory. 5. Avoid stressful situations. Live a simple life and avoid aggravations. Organize your time and prepare for the next day in anticipation. 6. Sleep well, avoid any interruptions of sleep and avoid any distractions in the bedroom that may interfere with adequate sleep quality 7. Avoid sugar, avoid sweets as there is a strong link between excessive sugar intake, diabetes, and  cognitive impairment We discussed the Mediterranean diet, which has been shown to help patients reduce the risk of progressive memory disorders and reduces cardiovascular risk. This includes eating fish, eat fruits and green leafy vegetables, nuts like almonds and hazelnuts, walnuts, and also use olive oil. Avoid fast foods and fried foods as much as possible. Avoid sweets and sugar as sugar use has been linked to worsening of memory function.  There is always a concern of gradual progression of memory problems. If this is the case, then we may need to adjust level of care according to patient needs. Support, both to the patient and caregiver, should then be put into place.         FALL PRECAUTIONS: Be cautious when walking. Scan the area for obstacles that may increase the risk of trips and falls. When getting up in the mornings, sit up at the edge of the bed for a few minutes before getting out of bed. Consider elevating the bed at the head end to avoid drop of blood pressure when getting up. Walk always in a well-lit room (use night lights in the walls). Avoid area rugs or power cords from appliances in the middle of the walkways. Use a walker or a cane if necessary and consider physical therapy for balance exercise. Get your eyesight checked regularly.  FINANCIAL OVERSIGHT: Supervision, especially oversight when making financial decisions or transactions is also recommended.  HOME SAFETY: Consider the safety of the kitchen when operating appliances like stoves, microwave oven, and blender. Consider having supervision and share cooking responsibilities until no longer able to participate in those. Accidents with firearms and other hazards in the house should be identified and addressed as  well.   ABILITY TO BE LEFT ALONE: If patient is unable to contact 911 operator, consider using LifeLine, or when the need is there, arrange for someone to stay with patients. Smoking is a fire hazard, consider  supervision or cessation. Risk of wandering should be assessed by caregiver and if detected at any point, supervision and safe proof recommendations should be instituted.  MEDICATION SUPERVISION: Inability to self-administer medication needs to be constantly addressed. Implement a mechanism to ensure safe administration of the medications.   DRIVING: Regarding driving, in patients with progressive memory problems, driving will be impaired. We advise to have someone else do the driving if trouble finding directions or if minor accidents are reported. Independent driving assessment is available to determine safety of driving.   If you are interested in the driving assessment, you can contact the following:  [facility] [employer] [facility] in [place] [phone]  [facility] [phone]  [facility] [phone] or [phone]    Mediterranean Diet A Mediterranean diet refers to food and lifestyle choices that are based on the traditions of countries located on the [employer]. This way of eating has been shown to help prevent certain conditions and improve outcomes for people who have chronic diseases, like kidney disease and heart disease. What are tips for following this plan? Lifestyle  Cook and eat meals together with your family, when possible. Drink enough fluid to keep your urine clear or pale yellow. Be physically active every day. This includes: Aerobic exercise like running or swimming. Leisure activities like gardening, walking, or housework. Get 7-8 hours of sleep each night. If recommended by your health care provider, drink red wine in moderation. This means 1 glass a day for nonpregnant women and 2 glasses a day for men. A glass of wine equals 5 oz (150 mL). Reading food labels  Check the serving size of packaged foods. For foods such as rice and pasta, the serving size refers to the amount of cooked  product, not dry. Check the total fat in packaged foods. Avoid foods that have saturated fat or trans fats. Check the ingredients list for added sugars, such as corn syrup. Shopping  At the grocery store, buy most of your food from the areas near the walls of the store. This includes: Fresh fruits and vegetables (produce). Grains, beans, nuts, and seeds. Some of these may be available in unpackaged forms or large amounts (in bulk). Fresh seafood. Poultry and eggs. Low-fat dairy products. Buy whole ingredients instead of prepackaged foods. Buy fresh fruits and vegetables in-season from local farmers markets. Buy frozen fruits and vegetables in resealable bags. If you do not have access to quality fresh seafood, buy precooked frozen shrimp or canned fish, such as tuna, salmon, or sardines. Buy small amounts of raw or cooked vegetables, salads, or olives from the deli or salad bar at your store. Stock your pantry so you always have certain foods on hand, such as olive oil, canned tuna, canned tomatoes, rice, pasta, and beans. Cooking  Cook foods with extra-virgin olive oil instead of using butter or other vegetable oils. Have meat as a side dish, and have vegetables or grains as your main dish. This means having meat in small portions or adding small amounts of meat to foods like pasta or stew. Use beans or vegetables instead of meat in common dishes like chili or lasagna. Experiment with different cooking methods. Try roasting or broiling vegetables instead of steaming or sauteing them.  Add frozen vegetables to soups, stews, pasta, or rice. Add nuts or seeds for added healthy fat at each meal. You can add these to yogurt, salads, or vegetable dishes. Marinate fish or vegetables using olive oil, lemon juice, garlic, and fresh herbs. Meal planning  Plan to eat 1 vegetarian meal one day each week. Try to work up to 2 vegetarian meals, if possible. Eat seafood 2 or more times a week. Have  healthy snacks readily available, such as: Vegetable sticks with hummus. Greek yogurt. Fruit and nut trail mix. Eat balanced meals throughout the week. This includes: Fruit: 2-3 servings a day Vegetables: 4-5 servings a day Low-fat dairy: 2 servings a day Fish, poultry, or lean meat: 1 serving a day Beans and legumes: 2 or more servings a week Nuts and seeds: 1-2 servings a day Whole grains: 6-8 servings a day Extra-virgin olive oil: 3-4 servings a day Limit red meat and sweets to only a few servings a month What are my food choices? Mediterranean diet Recommended Grains: Whole-grain pasta. Brown rice. Bulgar wheat. Polenta. Couscous. Whole-wheat bread. Orpah Cobb. Vegetables: Artichokes. Beets. Broccoli. Cabbage. Carrots. Eggplant. Green beans. Chard. Kale. Spinach. Onions. Leeks. Peas. Squash. Tomatoes. Peppers. Radishes. Fruits: Apples. Apricots. Avocado. Berries. Bananas. Cherries. Dates. Figs. Grapes. Lemons. Melon. Oranges. Peaches. Plums. Pomegranate. Meats and other protein foods: Beans. Almonds. Sunflower seeds. Pine nuts. Peanuts. Cod. Salmon. Scallops. Shrimp. Tuna. Tilapia. Clams. Oysters. Eggs. Dairy: Low-fat milk. Cheese. Greek yogurt. Beverages: Water. Red wine. Herbal tea. Fats and oils: Extra virgin olive oil. Avocado oil. Grape seed oil. Sweets and desserts: [place] yogurt with honey. Baked apples. Poached pears. Trail mix. Seasoning and other foods: Basil. Cilantro. Coriander. Cumin. Mint. Parsley. Sage. Rosemary. Tarragon. Garlic. Oregano. Thyme. Pepper. Balsalmic vinegar. Tahini. Hummus. Tomato sauce. Olives. Mushrooms. Limit these Grains: Prepackaged pasta or rice dishes. Prepackaged cereal with added sugar. Vegetables: Deep fried potatoes (french fries). Fruits: Fruit canned in syrup. Meats and other protein foods: Beef. Pork. Lamb. Poultry with skin. Hot dogs. Tomasa Blase. Dairy: Ice cream. Sour cream. Whole milk. Beverages: Juice. Sugar-sweetened soft drinks.  Beer. Liquor and spirits. Fats and oils: Butter. Canola oil. Vegetable oil. Beef fat (tallow). Lard. Sweets and desserts: Cookies. Cakes. Pies. Candy. Seasoning and other foods: Mayonnaise. Premade sauces and marinades. The items listed may not be a complete list. Talk with your dietitian about what dietary choices are right for you. Summary The Mediterranean diet includes both food and lifestyle choices. Eat a variety of fresh fruits and vegetables, beans, nuts, seeds, and whole grains. Limit the amount of red meat and sweets that you eat. Talk with your health care provider about whether it is safe for you to drink red wine in moderation. This means 1 glass a day for nonpregnant women and 2 glasses a day for men. A glass of wine equals 5 oz (150 mL). This information is not intended to replace advice given to you by your health care provider. Make sure you discuss any questions you have with your health care provider. Document Released: 01/01/2016 Document Revised: 02/03/2016 Document Reviewed: 01/01/2016 Elsevier Interactive Patient Education  2017 [employer].
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
## 2023-01-31 ENCOUNTER — Ambulatory Visit (INDEPENDENT_AMBULATORY_CARE_PROVIDER_SITE_OTHER): Payer: PPO | Admitting: Psychology

## 2023-01-31 DIAGNOSIS — G4700 Insomnia, unspecified: Secondary | ICD-10-CM

## 2023-01-31 DIAGNOSIS — R4189 Other symptoms and signs involving cognitive functions and awareness: Secondary | ICD-10-CM | POA: Diagnosis not present

## 2023-01-31 NOTE — Progress Notes (Signed)
   Neuropsychology Feedback Session Eligha Bridegroom. [facility] Department of Neurology  Reason for Referral:   Anneke Shakespeare is a 72 y.o. right-handed African-American female referred by Marlowe Kays, PA-C, to characterize her current cognitive functioning and assist with diagnostic clarity and treatment planning in the context of subjective cognitive decline.   Feedback:   Ms. Dossantos completed a comprehensive neuropsychological evaluation on 01/21/2023. Please refer to that encounter for the full report and recommendations. Briefly, results suggested neuropsychological functioning within normal limits relative to age-matched peers. Performances were quite strong across all assessed cognitive domains. This includes processing speed, attention/concentration, executive functioning, receptive and expressive language, visuospatial abilities, and all aspects of learning and memory. Subjective day-to-day dysfunction could be related to her report of prominent sleep dysfunction (an estimated two hours of sleep per night with prominent insomnia) and generally mild psychiatric distress.   Ms. Basich was unaccompanied during the current feedback session. Content of the current session focused on the results of her neuropsychological evaluation. Ms. Stoica was given the opportunity to ask questions and her questions were answered. She was encouraged to reach out should additional questions arise. A copy of her report was provided at the conclusion of the visit.      One unit [phone] was billed for Dr. Tammy Sours time spent preparing for, conducting, and documenting the current feedback session with Ms. Vasbinder.

## 2023-02-12 NOTE — Progress Notes (Unsigned)
Cardiology Clinic Note   Patient Name: Margaret Long Date of Encounter: 02/12/2023  Primary Care Provider:  Farris Has, MD Primary Cardiologist:  Little Ishikawa, MD  Patient Profile    72 year old female with history of hypertension, hyperlipidemia, type 2 diabetes, hypothyroidism and former tobacco use. Coronary CTA on 01/08/2021 showed a calcium score of 311 with nonobstructive CAD. She was started on metoprolol due to suspected microvascular disease, with significant improvement in her symptoms.  Last seen by me on 01/13/2023 her main complaint was that of dyspnea on exertion and dizziness, she was very sedentary.  She has a history of visual hallucinations and is being followed by neuropsychologist.  When last seen by me on 01/13/2023 she was found to be moderately orthostatic and therefore amlodipine was decreased from 5 mg daily to 2.5 mg daily.  She was to monitor her blood pressure and record this.  She was encouraged to increase her activity level to help with stamina.  Past Medical History    Past Medical History:  Diagnosis Date   Allergic rhinitis 08/25/2015   Carpal tunnel syndrome 08/25/2015   Essential hypertension 06/10/2015   Gastroesophageal reflux disease without esophagitis 08/25/2015   Hardening of the aorta (main artery of the heart) 08/03/2021   Hypercholesterolemia 07/29/2014   Hyperlipemia 08/25/2015   Insomnia 07/30/2014   Major depressive disorder 07/30/2014   Osteopenia 08/25/2015   Osteoporosis 08/25/2015   Postablative hypothyroidism 07/29/2014   Trochanteric bursitis of right hip 06/19/2018   Type 2 diabetes mellitus without complication, without long-term current use of insulin 07/29/2014   Visual hallucinations 10/22/2022   Vitamin B12 deficiency 08/19/2017   Vitamin D deficiency 07/30/2014   No past surgical history on file.  Allergies  Allergies  Allergen Reactions   Lisinopril Cough   Other Other (See Comments)    Other  reaction(s): Other (See Comments)  PAIN    History of Present Illness    Mrs. Margaret Long returns today for ongoing assessment and management of dizziness, dyspnea on exertion, hypertension, and chronic chest pain which is well-controlled on metoprolol.  On last office visit I decreased amlodipine to 2.5 mg from 5 mg daily she was to take her blood pressures daily and record this and bring back report.  Home Medications    Current Outpatient Medications  Medication Sig Dispense Refill   amLODipine (NORVASC) 2.5 MG tablet Take 1 tablet (2.5 mg total) by mouth daily. 90 tablet 3   aspirin 81 MG EC tablet Take 1 tablet by mouth daily.     Cholecalciferol 25 MCG (1000 UT) capsule Take 1,000 Units by mouth daily.     famotidine (PEPCID) 20 MG tablet Take by mouth.     ferrous sulfate 324 MG TBEC Take 324 mg by mouth.     levothyroxine (SYNTHROID) 75 MCG tablet Take 1 tablet by mouth daily.     losartan (COZAAR) 100 MG tablet 1 tablet daily.     metFORMIN (GLUCOPHAGE) 1000 MG tablet 1 tablet 2 (two) times daily with a meal.     metoprolol tartrate (LOPRESSOR) 25 MG tablet TAKE 1 TABLET(25 MG) BY MOUTH TWICE DAILY 180 tablet 3   nitroGLYCERIN (NITROSTAT) 0.4 MG SL tablet Place 1 tablet (0.4 mg total) under the tongue every 5 (five) minutes as needed for chest pain. 25 tablet 2   rosuvastatin (CRESTOR) 20 MG tablet TAKE 1 TABLET(20 MG) BY MOUTH DAILY 90 tablet 3   No current facility-administered medications for this visit.     Family  History    Family History  Problem Relation Age of Onset   Dementia Mother        unspecified type   Dementia Sister        unspecified type   She indicated that her mother is deceased. She indicated that her father is deceased. She indicated that the status of her sister is unknown.  Social History    Social History   Socioeconomic History   Marital status: Widowed    Spouse name: Not on file   Number of children: 4   Years of education: 12   Highest  education level: High school graduate  Occupational History   Occupation: Retired  Tobacco Use   Smoking status: Former    Current packs/day: 0.00    Types: Cigarettes    Quit date: 05/2003    Years since quitting: 19.7   Smokeless tobacco: Never  Substance and Sexual Activity   Alcohol use: No   Drug use: No   Sexual activity: Not on file  Other Topics Concern   Not on file  Social History Narrative   Right handed   Drinks decaffeine   Lives alone'   2nd floor apartment   retired   International aid/development worker of [employer] Strain: Not on file  Food Insecurity: No Food Insecurity (07/27/2019)   Hunger Vital Sign    Worried About Running Out of Food in the Last Year: Never true    Ran Out of Food in the Last Year: Never true  Transportation Needs: No Transportation Needs (07/27/2019)   PRAPARE - Administrator, Civil Service (Medical): No    Lack of Transportation (Non-Medical): No  Physical Activity: Not on file  Stress: Not on file  Social Connections: Not on file  Intimate Partner Violence: Not on file     Review of Systems    General:  No chills, fever, night sweats or weight changes.  Cardiovascular:  No chest pain, dyspnea on exertion, edema, orthopnea, palpitations, paroxysmal nocturnal dyspnea. Dermatological: No rash, lesions/masses Respiratory: No cough, dyspnea Urologic: No hematuria, dysuria Abdominal:   No nausea, vomiting, diarrhea, bright red blood per rectum, melena, or hematemesis Neurologic:  No visual changes, wkns, changes in mental status. All other systems reviewed and are otherwise negative except as noted above.       Physical Exam    VS:  There were no vitals taken for this visit. , BMI There is no height or weight on file to calculate BMI.     GEN: Well nourished, well developed, in no acute distress. HEENT: normal. Neck: Supple, no JVD, carotid bruits, or masses. Cardiac: RRR, no murmurs, rubs, or gallops. No  clubbing, cyanosis, edema.  Radials/DP/PT 2+ and equal bilaterally.  Respiratory:  Respirations regular and unlabored, clear to auscultation bilaterally. GI: Soft, nontender, nondistended, BS + x 4. MS: no deformity or atrophy. Skin: warm and dry, no rash. Neuro:  Strength and sensation are intact. Psych: Normal affect.      Lab Results  Component Value Date   WBC 4.9 08/20/2013   HGB 12.5 08/20/2013   HCT 37.4 08/20/2013   MCV 85.8 08/20/2013   PLT 280 08/20/2013   Lab Results  Component Value Date   CREATININE 0.76 01/01/2021   BUN 8 01/01/2021   NA 138 01/01/2021   K 4.0 01/01/2021   CL 101 01/01/2021   CO2 21 01/01/2021   Lab Results  Component Value Date   ALT 12  08/20/2013   AST 14 08/20/2013   ALKPHOS 110 08/20/2013   BILITOT 0.3 08/20/2013   Lab Results  Component Value Date   CHOL 117 01/12/2022   HDL 36 (L) 01/12/2022   LDLCALC 65 01/12/2022   TRIG 82 01/12/2022   CHOLHDL 3.3 01/12/2022    No results found for: "HGBA1C"   Review of Prior Studies    Coronary CTA with FFR on 01/08/2021 1. Left Main: No significant stenosis.   2. LAD: No significant stenosis. 3. LCX: No significant stenosis. 4. RCA: 0.82 mid RCA   IMPRESSION: 1.  CT FFR analysis didn't show any significant stenosis.  Echocardiogram 01/14/2021  1. Left ventricular ejection fraction, by estimation, is 65 to 70%. The  left ventricle has normal function. The left ventricle has no regional  wall motion abnormalities. There is mild left ventricular hypertrophy of  the basal-septal segment. Left  ventricular diastolic parameters are consistent with Grade I diastolic  dysfunction (impaired relaxation).   2. Right ventricular systolic function is normal. The right ventricular  size is normal. There is normal pulmonary artery systolic pressure.   3. The mitral valve is normal in structure. No evidence of mitral valve  regurgitation. No evidence of mitral stenosis.   4. The aortic valve  is tricuspid. Aortic valve regurgitation is not  visualized. No aortic stenosis is present.   5. The inferior vena cava is normal in size with greater than 50%  respiratory variability, suggesting right atrial pressure of 3 mmHg.      Assessment & Plan   1.  ***     {Are you ordering a CV Procedure (e.g. stress test, cath, DCCV, TEE, etc)?   Press F2        :[id number]}   Signed, Bettey Mare. Liborio Nixon, ANP, AACC   02/12/2023 5:22 PM      Office [phone] Fax [phone]  Notice: This dictation was prepared with Dragon dictation along with smaller phrase technology. Any transcriptional errors that result from this process are unintentional and may not be corrected upon review.

## 2023-02-14 ENCOUNTER — Ambulatory Visit: Payer: PPO | Attending: Adult Health | Admitting: Adult Health

## 2023-02-14 ENCOUNTER — Encounter: Payer: Self-pay | Admitting: Adult Health

## 2023-02-14 VITALS — BP 106/72 | HR 88 | Ht 66.0 in | Wt 135.2 lb

## 2023-02-14 DIAGNOSIS — E7800 Pure hypercholesterolemia, unspecified: Secondary | ICD-10-CM

## 2023-02-14 DIAGNOSIS — I10 Essential (primary) hypertension: Secondary | ICD-10-CM

## 2023-02-14 DIAGNOSIS — R441 Visual hallucinations: Secondary | ICD-10-CM | POA: Diagnosis not present

## 2023-02-14 DIAGNOSIS — I2510 Atherosclerotic heart disease of native coronary artery without angina pectoris: Secondary | ICD-10-CM

## 2023-02-14 MED ORDER — NITROGLYCERIN 0.4 MG SL SUBL: 0.4000 mg | SUBLINGUAL_TABLET | SUBLINGUAL | 2 refills | Status: DC | PRN

## 2023-02-14 MED ORDER — AMLODIPINE BESYLATE 5 MG PO TABS: 5.0000 mg | ORAL_TABLET | Freq: Every day | ORAL | 3 refills | Status: DC

## 2023-02-14 NOTE — Patient Instructions (Signed)
Medication Instructions:  Stop Metoprolol. Increase Amlodipine to 5 mg ( Take 1 Tablet Daily). *If you need a refill on your cardiac medications before your next appointment, please call your pharmacy*   Lab Work: No Labs If you have labs (blood work) drawn today and your tests are completely normal, you will receive your results only by: MyChart Message (if you have MyChart) OR A paper copy in the mail If you have any lab test that is abnormal or we need to change your treatment, we will call you to review the results.   Testing/Procedures: No Testing   Follow-Up: At [facility], you and your health needs are our priority.  As part of our continuing mission to provide you with exceptional heart care, we have created designated Provider Care Teams.  These Care Teams include your primary Cardiologist (physician) and Advanced Practice Providers (APPs -  Physician Assistants and Nurse Practitioners) who all work together to provide you with the care you need, when you need it.  We recommend signing up for the patient portal called "MyChart".  Sign up information is provided on this After Visit Summary.  MyChart is used to connect with patients for Virtual Visits (Telemedicine).  Patients are able to view lab/test results, encounter notes, upcoming appointments, etc.  Non-urgent messages can be sent to your provider as well.   To learn more about what you can do with MyChart, go to [email or web address].    Your next appointment:   1 month(s)  Provider:   Joni Reining, ANP, DNP

## 2023-02-18 ENCOUNTER — Telehealth: Payer: Self-pay | Admitting: Cardiology

## 2023-02-18 NOTE — Telephone Encounter (Signed)
Patient took Metoprolol morning and felt fine through the day.  She did NOT take it Monday night as it was discontinued  and then she felt Palpitations starting on Tuesday morning and has continued since then.   Amlodipine from 5 mg to 2.5 and that's "when I actually started feeling different". Note that at OV on the 23 rd this was increased back to 5mg  Daily.  She states now after stopping the metoprolol she is fatigued when walking any distance. She states burning I chest and throat continues as well.  She states it is difficult to do chores or anything since stopping the medication.   See possible change in amlodipine to diltiazem if continued issues/   Please advise

## 2023-02-18 NOTE — Telephone Encounter (Signed)
Pt c/o medication issue:  1. Name of Medication: Metoprolol  2. How are you currently taking this medication (dosage and times per day)?   3. Are you having a reaction (difficulty breathing--STAT)?   4. What is your medication issue?  Patient states since being taking off Metoprolol she has been having palpitations.  Patient c/o Palpitations:  High priority if patient c/o lightheadedness, shortness of breath, or chest pain  How long have you had palpitations/irregular HR/ Afib? Are you having the symptoms now?  Palpitations since Monday - no symptoms currents currently. Only occurs when up and moving around  Are you currently experiencing lightheadedness, SOB or CP?  No   Do you have a history of afib (atrial fibrillation) or irregular heart rhythm?  No   Have you checked your BP or HR? (document readings if available):  9/27: 132/85 82 this morning, 107 HR currently  Are you experiencing any other symptoms?  Chest pain that radiates to throat (not currently)

## 2023-02-21 NOTE — Telephone Encounter (Signed)
Call to patient with recommendations.  She states she is NOT going on the metoprolol because it caused "hallucinations in the past". She did not note any current hallucinations.  She will continue without this medication.  Offered sooner appt and she declined.  She states if worsens she will got to ED.    If she desires to go back on metoprolol to help with palpitations she should decrease her amlodipine to avoid hypotension.  If symptoms worsen she should be seen in ED for further evaluation.  KL

## 2023-03-11 NOTE — Progress Notes (Unsigned)
Cardiology Office Note:  .   Date:  03/14/2023  ID:  Margaret Long, DOB Apr 09, 1951, MRN [id number] PCP: Farris Has, MD  [facility] Providers Cardiologist:  Dr.Schumann  }   History of Present Illness: .   Margaret Long is a 72 y.o. female history of hypertension, hyperlipidemia, type 2 diabetes, hypothyroidism and former tobacco use. Coronary CTA on 01/08/2021 showed a calcium score of 311 with nonobstructive CAD. She was started on metoprolol due to suspected microvascular disease, with significant improvement in her symptoms.  Metoprolol was discontinued due to complaints of visual hallucinations.  Amlodipine was increased to 5 mg daily from 2.5 mg daily to maintain good blood pressure control.  She comes today with complete resolution of hallucinations off of metoprolol.  She continues to have some issues with elevated heart rate however.  She states that it sometimes causes some shortness of breath and dizziness.  Heart rate has been elevated greater than 100 bpm at rest.  She is being followed by endocrinologist for hypothyroidism with labs to be drawn next week on follow-up.   ROS: Positive for rapid heart rhythm, some associated shortness of breath.  Otherwise negative.  Studies Reviewed: .       Echocardiogram 01/14/2021 1. Left ventricular ejection fraction, by estimation, is 65 to 70%. The  left ventricle has normal function. The left ventricle has no regional  wall motion abnormalities. There is mild left ventricular hypertrophy of  the basal-septal segment. Left  ventricular diastolic parameters are consistent with Grade I diastolic  dysfunction (impaired relaxation).   2. Right ventricular systolic function is normal. The right ventricular  size is normal. There is normal pulmonary artery systolic pressure.   3. The mitral valve is normal in structure. No evidence of mitral valve  regurgitation. No evidence of mitral stenosis.   4. The aortic valve is tricuspid.  Aortic valve regurgitation is not  visualized. No aortic stenosis is present.   5. The inferior vena cava is normal in size with greater than 50%  respiratory variability, suggesting right atrial pressure of 3 mmHg.    Physical Exam:   VS:  BP 134/70   Pulse (!) 109   Ht 5\' 6"  (1.676 m)   Wt 136 lb (61.7 kg)   SpO2 98%   BMI 21.95 kg/m    Wt Readings from Last 3 Encounters:  03/14/23 136 lb (61.7 kg)  02/14/23 135 lb 3.2 oz (61.3 kg)  01/28/23 132 lb 6.4 oz (60.1 kg)    GEN: Well nourished, well developed in no acute distress NECK: No JVD; No carotid bruits CARDIAC: RRR tachycardic, no murmurs, rubs, gallops RESPIRATORY:  Clear to auscultation without rales, wheezing or rhonchi  ABDOMEN: Soft, non-tender, non-distended EXTREMITIES:  No edema; No deformity   ASSESSMENT AND PLAN: .    Tachycardia: Did not tolerate metoprolol causing hallucinations.  Since stopping it no further hallucinations.  However heart rate is elevated and she is symptomatic concerning shortness of breath.  I will start her on diltiazem 30 mg 3 times daily and stop amlodipine to assist with heart rate control and blood pressure.  I am going to see her in 2 weeks to see how she is feeling.  If she is tolerating the diltiazem but heart rate remains elevated will continue to titrate.  Then move to long-acting once she has tolerated CCB with better heart rate control.  2.  Hypertension: Currently on amlodipine 5 mg daily and losartan 100 mg daily.  I am  going to discontinue amlodipine, as I am starting diltiazem.  She is to keep track of her blood pressures at home.  Will follow-up in 1 month to evaluate her response to medication.  3.  Hypercholesterolemia: Currently on rosuvastatin 20 mg daily.  Labs are followed by PCP.  Goal of LDL less than 100.        Signed, Bettey Mare. Liborio Nixon, ANP, AACC

## 2023-03-14 ENCOUNTER — Encounter: Payer: Self-pay | Admitting: Adult Health

## 2023-03-14 ENCOUNTER — Ambulatory Visit: Payer: PPO | Attending: Adult Health | Admitting: Adult Health

## 2023-03-14 VITALS — BP 134/70 | HR 109 | Ht 66.0 in | Wt 136.0 lb

## 2023-03-14 DIAGNOSIS — I10 Essential (primary) hypertension: Secondary | ICD-10-CM | POA: Diagnosis not present

## 2023-03-14 DIAGNOSIS — I5032 Chronic diastolic (congestive) heart failure: Secondary | ICD-10-CM

## 2023-03-14 DIAGNOSIS — E7800 Pure hypercholesterolemia, unspecified: Secondary | ICD-10-CM

## 2023-03-14 DIAGNOSIS — R000 Tachycardia, unspecified: Secondary | ICD-10-CM

## 2023-03-14 MED ORDER — DILTIAZEM HCL 30 MG PO TABS: 30.0000 mg | ORAL_TABLET | Freq: Three times a day (TID) | ORAL | 0 refills | Status: DC

## 2023-03-14 NOTE — Patient Instructions (Addendum)
Medication Instructions:  Stop Amlodipine. Start Diltiazem 30 mg (Take 1 Tablet 3 Times Daily). *If you need a refill on your cardiac medications before your next appointment, please call your pharmacy*   Lab Work: No labs If you have labs (blood work) drawn today and your tests are completely normal, you will receive your results only by: MyChart Message (if you have MyChart) OR A paper copy in the mail If you have any lab test that is abnormal or we need to change your treatment, we will call you to review the results.   Testing/Procedures: No Testing   Follow-Up: At [facility], you and your health needs are our priority.  As part of our continuing mission to provide you with exceptional heart care, we have created designated Provider Care Teams.  These Care Teams include your primary Cardiologist (physician) and Advanced Practice Providers (APPs -  Physician Assistants and Nurse Practitioners) who all work together to provide you with the care you need, when you need it.  We recommend signing up for the patient portal called "MyChart".  Sign up information is provided on this After Visit Summary.  MyChart is used to connect with patients for Virtual Visits (Telemedicine).  Patients are able to view lab/test results, encounter notes, upcoming appointments, etc.  Non-urgent messages can be sent to your provider as well.   To learn more about what you can do with MyChart, go to [email or web address].    Your next appointment:   2 week(s)  Provider:   Joni Reining, NP

## 2023-03-21 DIAGNOSIS — I10 Essential (primary) hypertension: Secondary | ICD-10-CM | POA: Diagnosis not present

## 2023-03-21 DIAGNOSIS — D649 Anemia, unspecified: Secondary | ICD-10-CM | POA: Diagnosis not present

## 2023-03-21 DIAGNOSIS — E785 Hyperlipidemia, unspecified: Secondary | ICD-10-CM | POA: Diagnosis not present

## 2023-03-21 DIAGNOSIS — E039 Hypothyroidism, unspecified: Secondary | ICD-10-CM | POA: Diagnosis not present

## 2023-03-21 DIAGNOSIS — R700 Elevated erythrocyte sedimentation rate: Secondary | ICD-10-CM | POA: Diagnosis not present

## 2023-03-21 DIAGNOSIS — E1169 Type 2 diabetes mellitus with other specified complication: Secondary | ICD-10-CM | POA: Diagnosis not present

## 2023-03-21 DIAGNOSIS — E119 Type 2 diabetes mellitus without complications: Secondary | ICD-10-CM | POA: Diagnosis not present

## 2023-03-21 DIAGNOSIS — R109 Unspecified abdominal pain: Secondary | ICD-10-CM | POA: Diagnosis not present

## 2023-03-27 NOTE — Progress Notes (Unsigned)
Cardiology Office Note:  .   Date:  03/29/2023  ID:  Margaret Long, DOB [birth year], MRN [id number] PCP: Margaret Has, MD  [facility] Providers Cardiologist:Christopher Karlyne Greenspan, MD }   History of Present Illness: .   Margaret Long is a 72 y.o. female  history of hypertension, hyperlipidemia, type 2 diabetes, hypothyroidism and former tobacco use. Coronary CTA on 01/08/2021 showed a calcium score of 311 with nonobstructive CAD. She was started on metoprolol due to suspected microvascular disease, with significant improvement in her symptoms.  Metoprolol was discontinued due to complaints of visual hallucinations consistent with discontinuation of metoprolol the patient was having rapid heart rhythm.    She reports that this was greater than 100 bpm at rest.  I started her on low-dose diltiazem 30 mg 3 times a day and and stopped amlodipine to prevent hypotension.  She is here for close follow-up to evaluate response to medication and for evaluation of blood pressure on new medication regimen.  She comes today stating that her heart rate is still elevated but she is tolerating the diltiazem very well.  She states she can even tell that she is taking it.  She denies any lower extremity edema or exacerbation of GI symptoms.  She does have some chronic GI issues and she is going to follow-up with GI routinely.  She did note that her TSH levels were off per her primary care and levothyroxine was decreased from 75 mcg daily to 50 mcg daily.  ROS: As above otherwise negative.  Studies Reviewed: .    Echocardiogram 01/14/2021 1. Left ventricular ejection fraction, by estimation, is 65 to 70%. The  left ventricle Long normal function. The left ventricle Long no regional  wall motion abnormalities. There is mild left ventricular hypertrophy of  the basal-septal segment. Left  ventricular diastolic parameters are consistent with Grade I diastolic  dysfunction (impaired relaxation).   2. Right  ventricular systolic function is normal. The right ventricular  size is normal. There is normal pulmonary artery systolic pressure.   3. The mitral valve is normal in structure. No evidence of mitral valve  regurgitation. No evidence of mitral stenosis.   4. The aortic valve is tricuspid. Aortic valve regurgitation is not  visualized. No aortic stenosis is present.   5. The inferior vena cava is normal in size with greater than 50%  respiratory variability, suggesting right atrial pressure of 3 mmHg.   EKG Interpretation Date/Time:  Tuesday March 29 2023 09:49:18 EST Ventricular Rate:  101 PR Interval:  146 QRS Duration:  78 QT Interval:  342 QTC Calculation: 443 R Axis:   47  Text Interpretation: Sinus tachycardia When compared with ECG of 13-Jan-2023 10:21, Nonspecific T wave abnormality now evident in Lateral leads Confirmed by Joni Reining [phone]) on 03/29/2023 10:13:31 AM    Physical Exam:   VS:  BP 136/76   Pulse (!) 115   Ht 5\' 6"  (1.676 m)   Wt 135 lb (61.2 kg)   SpO2 98%   BMI 21.79 kg/m    Wt Readings from Last 3 Encounters:  03/29/23 135 lb (61.2 kg)  03/14/23 136 lb (61.7 kg)  02/14/23 135 lb 3.2 oz (61.3 kg)    GEN: Well nourished, well developed in no acute distress NECK: No JVD; No carotid bruits CARDIAC: RRR, tachycardic, no murmurs, rubs, gallops RESPIRATORY:  Clear to auscultation without rales, wheezing or rhonchi  ABDOMEN: Soft, non-tender, non-distended EXTREMITIES:  No edema; No deformity   ASSESSMENT AND  PLAN: .   Chronic tachycardia: She is tolerating diltiazem but will need to titrate up for better heart rate control.  Will change her to diltiazem 120 mg long-acting daily.  Will see her again in 1 month to see how well she responds to the medication.  2.  Hypothyroidism: Being seen by PCP with levothyroxine dose decreased to 50 mcg daily.  3.  Hypertension: Blood pressure is very well-controlled.  Will watch this closely with increased  dose of diltiazem.  She is no longer on amlodipine if this was discontinued by starting the diltiazem.  May need to decrease losartan to 50 mg from 100 mg on next office visit if she becomes symptomatically hypotensive.  4.  Hypercholesterolemia: Followed by PCP.  Rosuvastatin 20 mg daily is continued.      Signed, Bettey Mare. Liborio Nixon, ANP, AACC

## 2023-03-29 ENCOUNTER — Encounter: Payer: Self-pay | Admitting: Adult Health

## 2023-03-29 ENCOUNTER — Ambulatory Visit: Payer: PPO | Attending: Adult Health | Admitting: Adult Health

## 2023-03-29 VITALS — BP 136/76 | HR 115 | Ht 66.0 in | Wt 135.0 lb

## 2023-03-29 DIAGNOSIS — R079 Chest pain, unspecified: Secondary | ICD-10-CM | POA: Diagnosis not present

## 2023-03-29 MED ORDER — DILTIAZEM HCL ER COATED BEADS 120 MG PO CP24: 120.0000 mg | ORAL_CAPSULE | Freq: Every day | ORAL | 3 refills | Status: DC

## 2023-03-29 NOTE — Patient Instructions (Signed)
Medication Instructions:  Stop Diltiazem 30 mg. Start Diltiazem 120 mg (  Take 1 capsule Daily) *If you need a refill on your cardiac medications before your next appointment, please call your pharmacy*   Lab Work: No Labs If you have labs (blood work) drawn today and your tests are completely normal, you will receive your results only by: MyChart Message (if you have MyChart) OR A paper copy in the mail If you have any lab test that is abnormal or we need to change your treatment, we will call you to review the results.   Testing/Procedures: No Testing   Follow-Up: At [facility], you and your health needs are our priority.  As part of our continuing mission to provide you with exceptional heart care, we have created designated Provider Care Teams.  These Care Teams include your primary Cardiologist (physician) and Advanced Practice Providers (APPs -  Physician Assistants and Nurse Practitioners) who all work together to provide you with the care you need, when you need it.  We recommend signing up for the patient portal called "MyChart".  Sign up information is provided on this After Visit Summary.  MyChart is used to connect with patients for Virtual Visits (Telemedicine).  Patients are able to view lab/test results, encounter notes, upcoming appointments, etc.  Non-urgent messages can be sent to your provider as well.   To learn more about what you can do with MyChart, go to [email or web address].    Your next appointment:   1 month(s)  Provider:   Joni Reining, ANP, DNP

## 2023-04-02 IMAGING — MR MR ABDOMEN WO/W CM
11 of 17 series · 28 of 48 positions shown · IV contrast (multihance)
Comparison: Renal ultrasound, 11/04/2020

CLINICAL DATA: Right renal cyst

EXAM:
MRI ABDOMEN WITHOUT AND WITH CONTRAST
TECHNIQUE: Multiplanar multisequence MR imaging of the abdomen was performed
both before and after the administration of intravenous contrast.
CONTRAST:  14mL MULTIHANCE GADOBENATE DIMEGLUMINE 529 MG/ML IV SOLN

[Series 3: cor haste · coronal · 5.0mm · 0.68mm/px · 2 of 32 slices shown]
[im 1/32]
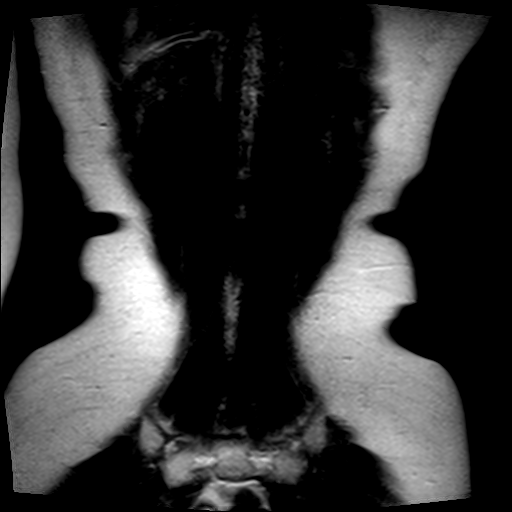
[im 32/32]
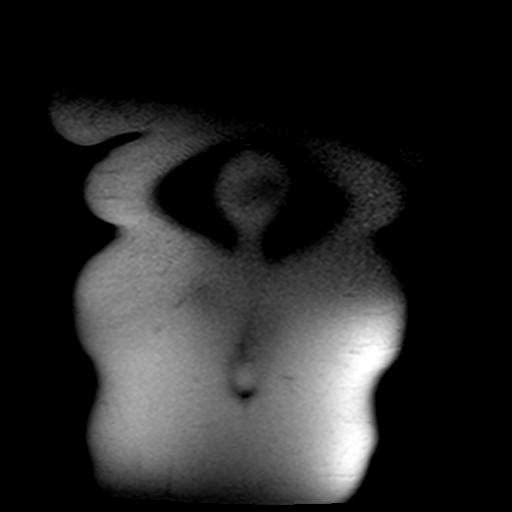

[Series 4: axial haste · axial · 6.0mm · 0.68mm/px · z∈[-82,+129]mm · 2 of 33 slices shown]
[im 1/33]
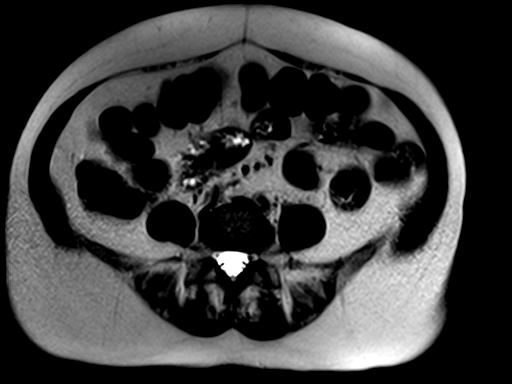
[im 33/33]
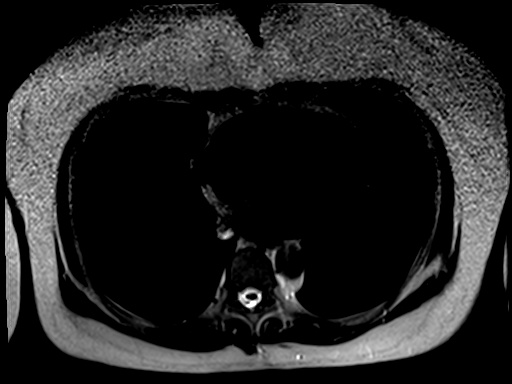

[Series 5: T1 · axial · 6.0mm · 0.68mm/px · z∈[-82,+129]mm · 4 of 66 slices shown]
[im 1/66]
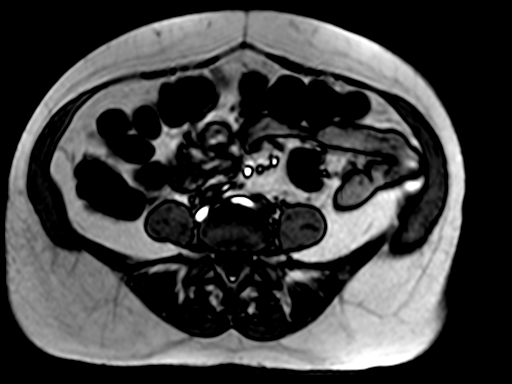
[im 22/66]
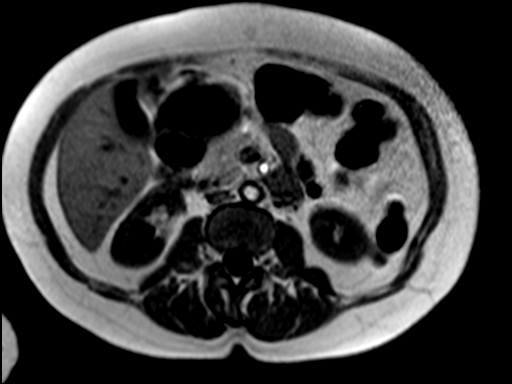
[im 44/66]
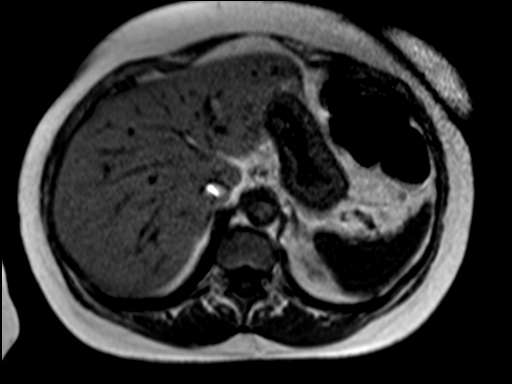
[im 66/66]
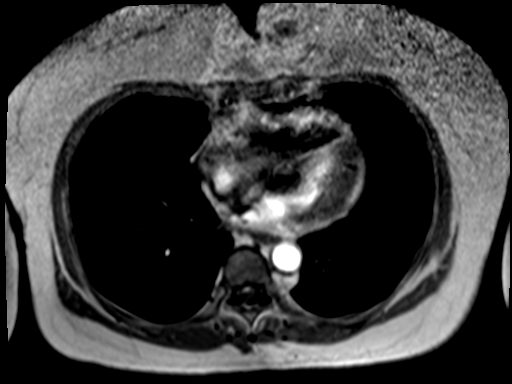

[Series 6: bSSFP · axial · 4.0mm · 0.68mm/px · z∈[-97,+143]mm · 3 of 61 slices shown]
[im 1/61]
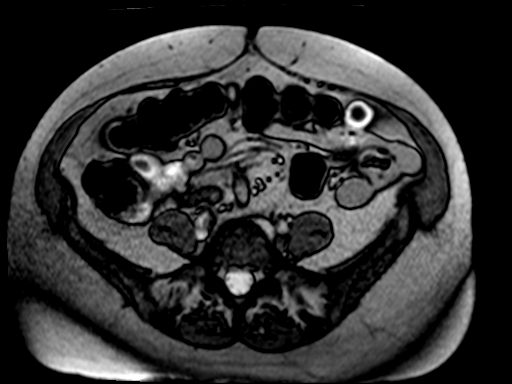
[im 31/61]
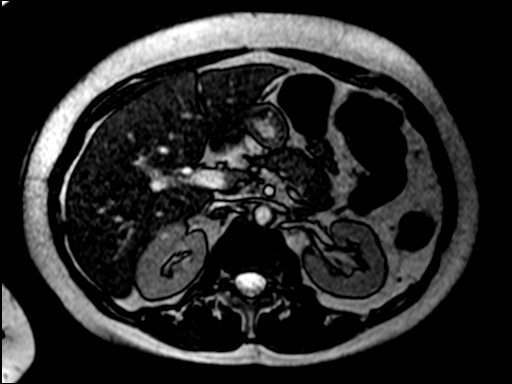
[im 61/61]
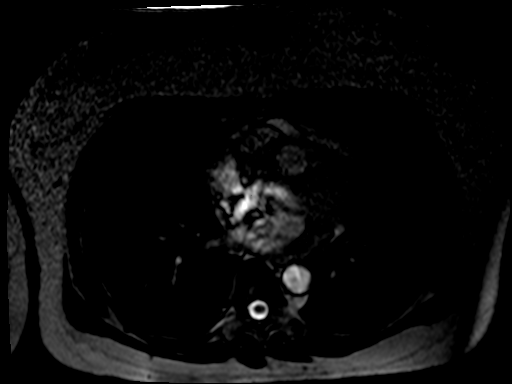

[Series 7: T2 fat-sat · axial · 6.0mm · 1.09mm/px · z∈[-62,+161]mm · 2 of 32 slices shown]
[im 1/32]
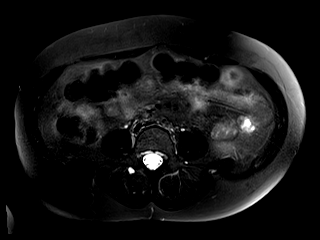
[im 32/32]
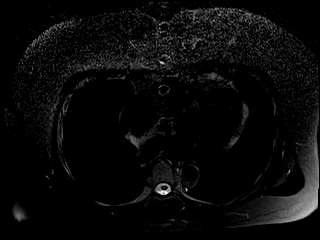

[Series 8: ep2d_diff_b50_500_800_p2_trig · axial · 6.0mm · 1.82mm/px · z∈[-62,+161]mm · 4 of 96 slices shown]
[im 1/96]
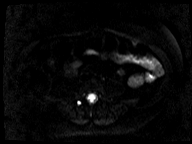
[im 32/96]
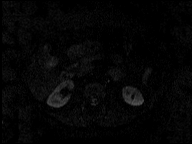
[im 64/96]
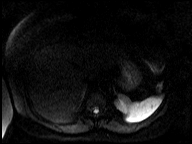
[im 96/96]
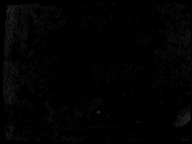

[Series 9: ep2d_diff_b50_500_800_p2_trig_adc · axial · 6.0mm · 1.82mm/px · 1 of 32 slices shown]
[im 1/32]
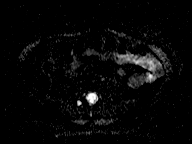

[Series 10: T1 dynamic · axial · non-contrast · 2.5mm · 0.74mm/px · z∈[-85,+132]mm · 3 of 88 slices shown]
[im 1/88]
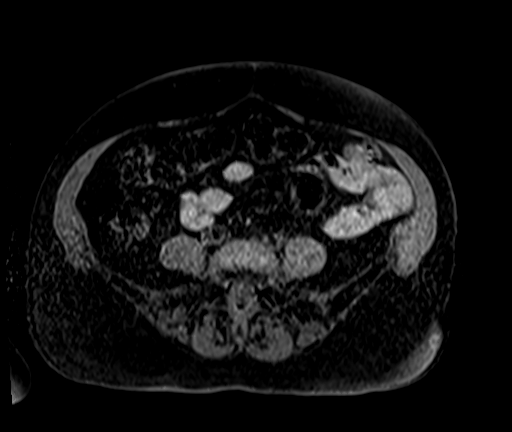
[im 44/88]
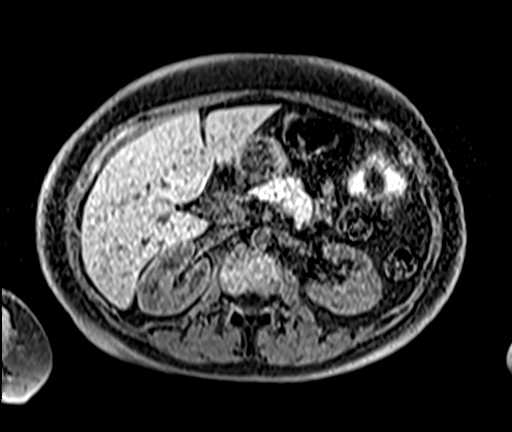
[im 88/88]
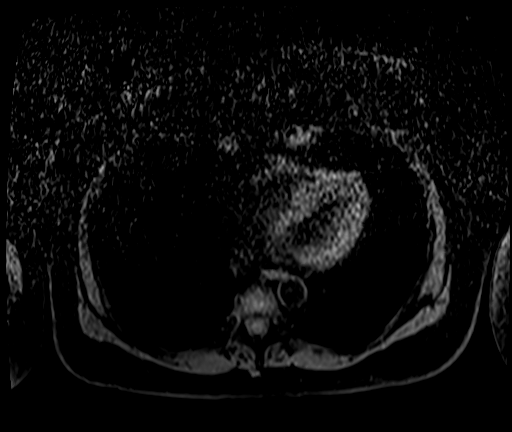

[Series 11: T1 dynamic post-contrast · axial · 2.5mm · 0.74mm/px · z∈[-85,+132]mm · 3 of 88 slices shown (1 of 3)]
[im 1/88]
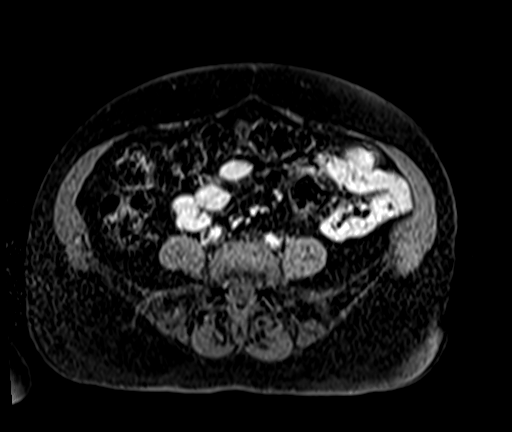
[im 44/88]
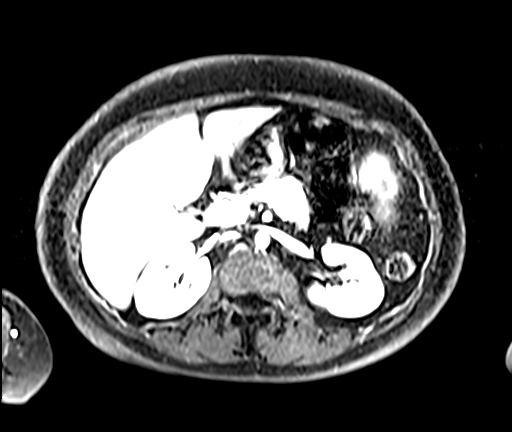
[im 88/88]
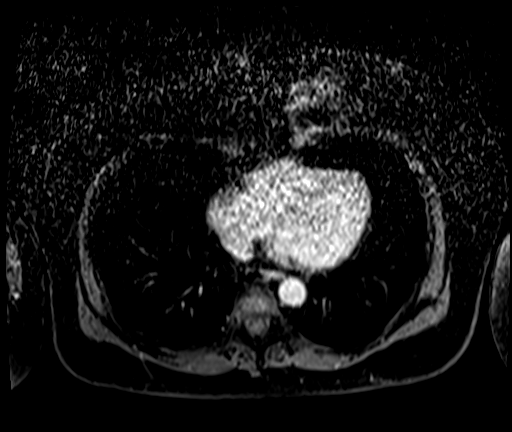

[Series 12: T1 dynamic post-contrast · axial · 2.5mm · 0.74mm/px · z∈[-85,+132]mm · 3 of 88 slices shown (2 of 3)]
[im 1/88]
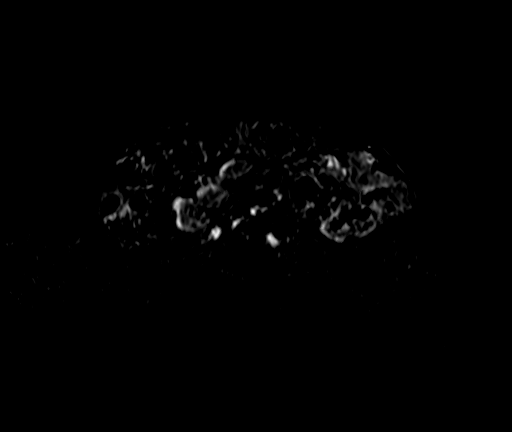
[im 44/88]
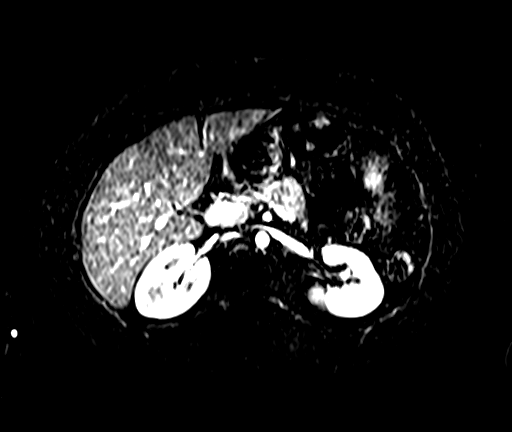
[im 88/88]
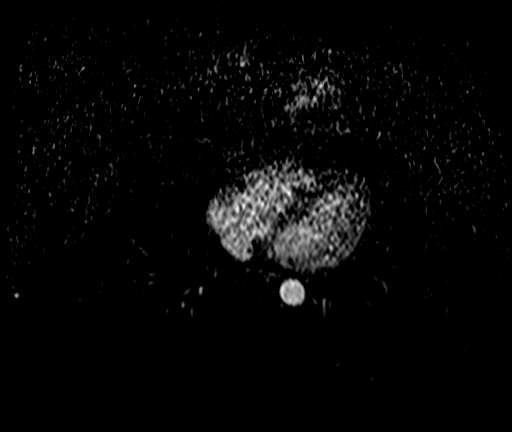

[Series 13: T1 dynamic post-contrast · axial · 2.5mm · 0.74mm/px · 1 of 88 slices shown (3 of 3)]
[im 1/88]
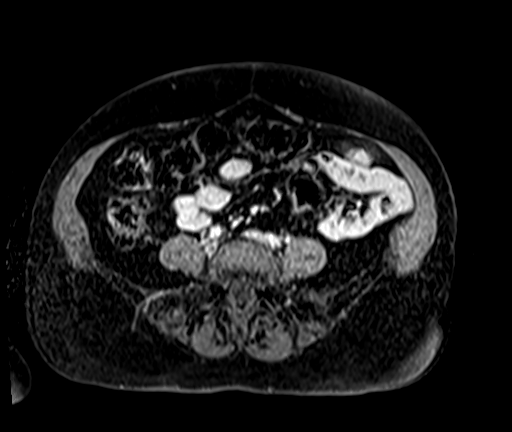

[28 of 48 positions shown; findings below may reference images not displayed]

FINDINGS: Lower chest: No acute findings.

Hepatobiliary: Mild hepatic steatosis. No mass or other parenchymal
abnormality identified. No gallstones. No biliary ductal dilatation.

Pancreas: No mass, inflammatory changes, or other parenchymal
abnormality identified. No biliary ductal dilatation.

Spleen:  Within normal limits in size and appearance.

Adrenals/Urinary Tract: No masses identified. There is a fluid
signal, possibly thinly septated cyst in the inferior pole of the
right kidney with no evidence of associated contrast enhancement or
solid component, measuring 2.3 cm (series 4, image 23). No evidence
of hydronephrosis.

Stomach/Bowel: Visualized portions within the abdomen are
unremarkable.

Vascular/Lymphatic: No pathologically enlarged lymph nodes
identified. No abdominal aortic aneurysm demonstrated. Aortic
atherosclerosis.

Other:  None.

Musculoskeletal: No suspicious bone lesions identified.
IMPRESSION: 1. There is a fluid signal, possibly thinly septated cyst in the
inferior pole of the right kidney, measuring 2.3 cm. No evidence of
associated contrast enhancement or solid component. This is a
Bosniak category II cyst, benign, for which no further follow-up or
characterization is required.
2. Mild hepatic steatosis.

Aortic Atherosclerosis ([id number]).

## 2023-04-04 ENCOUNTER — Other Ambulatory Visit: Payer: Self-pay | Admitting: Family Medicine

## 2023-04-04 DIAGNOSIS — R109 Unspecified abdominal pain: Secondary | ICD-10-CM

## 2023-04-15 ENCOUNTER — Ambulatory Visit
Admission: RE | Admit: 2023-04-15 | Discharge: 2023-04-15 | Disposition: A | Discharge status: Home / Self Care | Payer: PPO | Source: Ambulatory Visit | Attending: Family Medicine | Admitting: Family Medicine

## 2023-04-15 DIAGNOSIS — R109 Unspecified abdominal pain: Secondary | ICD-10-CM | POA: Diagnosis not present

## 2023-04-15 DIAGNOSIS — K5900 Constipation, unspecified: Secondary | ICD-10-CM | POA: Diagnosis not present

## 2023-04-15 MED ORDER — IOPAMIDOL (ISOVUE-300) INJECTION 61%
500.0000 mL | Freq: Once | INTRAVENOUS | Status: AC | PRN
Start: 2023-04-15 — End: 2023-04-15
  Administered 2023-04-15: 80 mL via INTRAVENOUS

## 2023-04-20 DIAGNOSIS — E1122 Type 2 diabetes mellitus with diabetic chronic kidney disease: Secondary | ICD-10-CM | POA: Diagnosis not present

## 2023-04-20 DIAGNOSIS — N179 Acute kidney failure, unspecified: Secondary | ICD-10-CM | POA: Diagnosis not present

## 2023-04-20 DIAGNOSIS — R809 Proteinuria, unspecified: Secondary | ICD-10-CM | POA: Diagnosis not present

## 2023-04-20 DIAGNOSIS — N281 Cyst of kidney, acquired: Secondary | ICD-10-CM | POA: Diagnosis not present

## 2023-04-20 DIAGNOSIS — I10 Essential (primary) hypertension: Secondary | ICD-10-CM | POA: Diagnosis not present

## 2023-04-25 NOTE — Progress Notes (Unsigned)
  Cardiology Office Note:  .   Date:  04/28/2023  ID:  Margaret Long, DOB [birth year], MRN [id number] PCP: Farris Has, MD  [facility] Providers Cardiologist:  Little Ishikawa, MD }   }   History of Present Illness: .   Margaret Long is a 72 y.o. female history of hypertension, hyperlipidemia, type 2 diabetes, hypothyroidism and former tobacco use. Coronary CTA on 01/08/2021 showed a calcium score of 311 with nonobstructive CAD  Unable to tolerate metoprolol due to visual hallucinations.   She does have some chronic GI issues and she is going to follow-up with GI routinely. She did note that her TSH levels were off per her primary care and levothyroxine was decreased from 75 mcg daily to 50 mcg daily.   On last office visit she was transitioned to long acting diltiazem 120 mg daily.She is here for follow up and evaluation for BP response to medications.   She is tolerating diltiazem long-acting well without any significant rapid heart rhythms.  She has had recent TSH drawn by primary care provider and she reports this is elevated.  She is to follow-up with primary care for further adjustments in thyroid medications as she is status post thyroidectomy.  She does complain of some shortness of breath sometimes in the morning having to stop.  Heart rate is slightly elevated at that time.  She denies any syncope, shortness of breath, or dizziness associated with this.  She is beginning to see flashes of light in her vision and is is going to be followed up with ophthalmology.  ROS: As above otherwise negative  Studies Reviewed: Marland Kitchen      No new studies to review Physical Exam:   VS:  BP 124/74   Pulse 98   Ht 5\' 6"  (1.676 m)   Wt 135 lb (61.2 kg)   SpO2 98%   BMI 21.79 kg/m    Wt Readings from Last 3 Encounters:  04/28/23 135 lb (61.2 kg)  03/29/23 135 lb (61.2 kg)  03/14/23 136 lb (61.7 kg)    GEN: Well nourished, well developed in no acute distress NECK: No JVD; No carotid  bruits CARDIAC: RRR, no murmurs, rubs, gallops RESPIRATORY:  Clear to auscultation without rales, wheezing or rhonchi  ABDOMEN: Soft, non-tender, non-distended EXTREMITIES:  No edema; No deformity   ASSESSMENT AND PLAN: .    PSVT with elevated heart rate: Doing somewhat better on diltiazem 120 mg long-acting daily.  Does not tolerate metoprolol due to visual hallucinations.  I will continue her on this current medication regimen with plenty refills.  Will see her back in 6 months.  Pleased with her progress  2.  Thyroidectomy: Being followed by primary care for adjustments.  She stated her latest labs revealed elevated TSH to 11.  She is going to call them today to ask about medication changes.       Signed, Bettey Mare. Liborio Nixon, ANP, AACC

## 2023-04-28 ENCOUNTER — Encounter: Payer: Self-pay | Admitting: Adult Health

## 2023-04-28 ENCOUNTER — Ambulatory Visit: Payer: PPO | Attending: Adult Health | Admitting: Adult Health

## 2023-04-28 VITALS — BP 124/74 | HR 98 | Ht 66.0 in | Wt 135.0 lb

## 2023-04-28 DIAGNOSIS — E038 Other specified hypothyroidism: Secondary | ICD-10-CM

## 2023-04-28 DIAGNOSIS — R441 Visual hallucinations: Secondary | ICD-10-CM | POA: Diagnosis not present

## 2023-04-28 DIAGNOSIS — R000 Tachycardia, unspecified: Secondary | ICD-10-CM | POA: Diagnosis not present

## 2023-04-28 MED ORDER — DILTIAZEM HCL ER COATED BEADS 120 MG PO CP24: 120.0000 mg | ORAL_CAPSULE | Freq: Every day | ORAL | 3 refills | Status: DC

## 2023-04-28 NOTE — Patient Instructions (Signed)
Medication Instructions:  No Changes *If you need a refill on your cardiac medications before your next appointment, please call your pharmacy*   Lab Work: No Labs If you have labs (blood work) drawn today and your tests are completely normal, you will receive your results only by: MyChart Message (if you have MyChart) OR A paper copy in the mail If you have any lab test that is abnormal or we need to change your treatment, we will call you to review the results.   Testing/Procedures: No Testing   Follow-Up: At [facility], you and your health needs are our priority.  As part of our continuing mission to provide you with exceptional heart care, we have created designated Provider Care Teams.  These Care Teams include your primary Cardiologist (physician) and Advanced Practice Providers (APPs -  Physician Assistants and Nurse Practitioners) who all work together to provide you with the care you need, when you need it.  We recommend signing up for the patient portal called "MyChart".  Sign up information is provided on this After Visit Summary.  MyChart is used to connect with patients for Virtual Visits (Telemedicine).  Patients are able to view lab/test results, encounter notes, upcoming appointments, etc.  Non-urgent messages can be sent to your provider as well.   To learn more about what you can do with MyChart, go to [email or web address].    Your next appointment:   6 month(s)  Provider:   Joni Reining, DNP, ANP  or , Little Ishikawa, MD

## 2023-05-13 DIAGNOSIS — Z1231 Encounter for screening mammogram for malignant neoplasm of breast: Secondary | ICD-10-CM | POA: Diagnosis not present

## 2023-08-05 DIAGNOSIS — H25813 Combined forms of age-related cataract, bilateral: Secondary | ICD-10-CM | POA: Diagnosis not present

## 2023-08-12 DIAGNOSIS — E538 Deficiency of other specified B group vitamins: Secondary | ICD-10-CM | POA: Diagnosis not present

## 2023-08-12 DIAGNOSIS — D649 Anemia, unspecified: Secondary | ICD-10-CM | POA: Diagnosis not present

## 2023-08-12 DIAGNOSIS — E039 Hypothyroidism, unspecified: Secondary | ICD-10-CM | POA: Diagnosis not present

## 2023-08-12 DIAGNOSIS — Z0000 Encounter for general adult medical examination without abnormal findings: Secondary | ICD-10-CM | POA: Diagnosis not present

## 2023-08-12 DIAGNOSIS — E559 Vitamin D deficiency, unspecified: Secondary | ICD-10-CM | POA: Diagnosis not present

## 2023-08-12 DIAGNOSIS — E1169 Type 2 diabetes mellitus with other specified complication: Secondary | ICD-10-CM | POA: Diagnosis not present

## 2023-08-12 DIAGNOSIS — E785 Hyperlipidemia, unspecified: Secondary | ICD-10-CM | POA: Diagnosis not present

## 2023-08-12 DIAGNOSIS — I10 Essential (primary) hypertension: Secondary | ICD-10-CM | POA: Diagnosis not present

## 2023-08-12 DIAGNOSIS — R809 Proteinuria, unspecified: Secondary | ICD-10-CM | POA: Diagnosis not present

## 2023-08-14 ENCOUNTER — Other Ambulatory Visit: Payer: Self-pay | Admitting: Adult Health

## 2023-10-06 DIAGNOSIS — E039 Hypothyroidism, unspecified: Secondary | ICD-10-CM | POA: Diagnosis not present

## 2023-11-30 ENCOUNTER — Other Ambulatory Visit: Payer: Self-pay | Admitting: Adult Health

## 2023-12-04 ENCOUNTER — Other Ambulatory Visit: Payer: Self-pay | Admitting: Cardiology

## 2023-12-05 DIAGNOSIS — E039 Hypothyroidism, unspecified: Secondary | ICD-10-CM | POA: Diagnosis not present

## 2023-12-05 DIAGNOSIS — I2510 Atherosclerotic heart disease of native coronary artery without angina pectoris: Secondary | ICD-10-CM | POA: Diagnosis not present

## 2023-12-05 DIAGNOSIS — Z7982 Long term (current) use of aspirin: Secondary | ICD-10-CM | POA: Diagnosis not present

## 2023-12-05 DIAGNOSIS — I4891 Unspecified atrial fibrillation: Secondary | ICD-10-CM | POA: Diagnosis not present

## 2023-12-05 DIAGNOSIS — E785 Hyperlipidemia, unspecified: Secondary | ICD-10-CM | POA: Diagnosis not present

## 2023-12-05 DIAGNOSIS — E1169 Type 2 diabetes mellitus with other specified complication: Secondary | ICD-10-CM | POA: Diagnosis not present

## 2023-12-05 DIAGNOSIS — D509 Iron deficiency anemia, unspecified: Secondary | ICD-10-CM | POA: Diagnosis not present

## 2023-12-05 DIAGNOSIS — Z87891 Personal history of nicotine dependence: Secondary | ICD-10-CM | POA: Diagnosis not present

## 2023-12-05 DIAGNOSIS — M1990 Unspecified osteoarthritis, unspecified site: Secondary | ICD-10-CM | POA: Diagnosis not present

## 2023-12-05 DIAGNOSIS — I10 Essential (primary) hypertension: Secondary | ICD-10-CM | POA: Diagnosis not present

## 2023-12-05 DIAGNOSIS — K219 Gastro-esophageal reflux disease without esophagitis: Secondary | ICD-10-CM | POA: Diagnosis not present

## 2023-12-18 NOTE — Progress Notes (Unsigned)
 Cardiology Office Note:    Date:  12/23/2023   ID:  Margaret Long, DOB [birth year], MRN [id number]  PCP:  Kip Righter, MD  Cardiologist:  Lonni LITTIE Nanas, MD  Electrophysiologist:  None   Referring MD: Kip Righter, MD   Chief Complaint  Patient presents with   Chest Pain    History of Present Illness:    Margaret Long is a 73 y.o. female with a hx of T2DM, hypertension, hyperlipidemia, hypothyroidism, former tobacco use who presents for follow-up.  She was referred by Dr. Douglass for evaluation of dyspnea on exertion, initially seen on 12/30/2020.  She reported that she has been having chest pain and dyspnea with minimal exertion.  Reports reliably has symptoms when she walks up stairs.  Describes burning in center of her chest and shortness of breath that resolves within 1 to 2 minutes of rest.  Feels lightheaded during episode, denies any syncope.  Denies any palpitations or lower extremity edema.  She smoked almost 1 pack/day for 36 years, quit in 2005.  Family history includes brother had multiple MIs, first in late 50s/early 16s, and sister has PPM.  Echocardiogram on 01/15/2021 showed normal biventricular function, no significant valvular disease.  Coronary CTA on 01/08/2021 showed calcium  score 311 (89th percentile), nonobstructive CAD.  Since last clinic visit, she reports she is doing okay.  States that she has been having exertional chest pain and dyspnea.  Can occur with minimal exertion.  Feels burning in center of chest and short of breath.  She stopped her metoprolol  due to visual hallucinations.   Past Medical History:  Diagnosis Date   Allergic rhinitis 08/25/2015   Carpal tunnel syndrome 08/25/2015   Essential hypertension 06/10/2015   Gastroesophageal reflux disease without esophagitis 08/25/2015   Hardening of the aorta (main artery of the heart) 08/03/2021   Hypercholesterolemia 07/29/2014   Hyperlipemia 08/25/2015   Insomnia 07/30/2014   Major depressive  disorder 07/30/2014   Osteopenia 08/25/2015   Osteoporosis 08/25/2015   Postablative hypothyroidism 07/29/2014   Trochanteric bursitis of right hip 06/19/2018   Type 2 diabetes mellitus without complication, without long-term current use of insulin  07/29/2014   Visual hallucinations 10/22/2022   Vitamin B12 deficiency 08/19/2017   Vitamin D deficiency 07/30/2014    History reviewed. No pertinent surgical history.  Current Medications: Current Meds  Medication Sig   amLODipine  (NORVASC ) 5 MG tablet Take 1 tablet (5 mg total) by mouth daily.   aspirin 81 MG EC tablet Take 1 tablet by mouth daily.   famotidine (PEPCID) 20 MG tablet Take by mouth.   ferrous sulfate 324 MG TBEC Take 324 mg by mouth.   levothyroxine (SYNTHROID) 75 MCG tablet Take 1 tablet by mouth daily.   losartan (COZAAR) 100 MG tablet 1 tablet daily.   metFORMIN  (GLUCOPHAGE ) 1000 MG tablet 1 tablet 2 (two) times daily with a meal.   nitroGLYCERIN  (NITROSTAT ) 0.4 MG SL tablet PLACE 1 TABLET UNDER THE TONGUE EVERY 5 MINUTES AS NEEDED FOR CHEST PAIN   rosuvastatin  (CRESTOR ) 20 MG tablet TAKE 1 TABLET(20 MG) BY MOUTH DAILY   [DISCONTINUED] diltiazem  (CARDIZEM  CD) 120 MG 24 hr capsule Take 1 capsule (120 mg total) by mouth daily.     Allergies:   Lisinopril, Metoprolol , and Other   Social History   Socioeconomic History   Marital status: Widowed    Spouse name: Not on file   Number of children: 4   Years of education: 12   Highest education level: High school graduate  Occupational History   Occupation: Retired  Tobacco Use   Smoking status: Former    Current packs/day: 0.00    Types: Cigarettes    Quit date: 05/2003    Years since quitting: 20.5   Smokeless tobacco: Never  Substance and Sexual Activity   Alcohol use: No   Drug use: No   Sexual activity: Not on file  Other Topics Concern   Not on file  Social History Narrative   Right handed   Drinks decaffeine   Lives alone'   2nd floor apartment    retired   Social Drivers of Corporate investment banker Strain: Not on file  Food Insecurity: No Food Insecurity (07/27/2019)   Hunger Vital Sign    Worried About Running Out of Food in the Last Year: Never true    Ran Out of Food in the Last Year: Never true  Transportation Needs: No Transportation Needs (07/27/2019)   PRAPARE - Administrator, Civil Service (Medical): No    Lack of Transportation (Non-Medical): No  Physical Activity: Not on file  Stress: Not on file  Social Connections: Not on file     Family History: Family history includes brother had multiple MIs, first in late 50s/early 29s, and sister has PPM.  ROS:   Please see the history of present illness.     All other systems reviewed and are negative.  EKGs/Labs/Other Studies Reviewed:    The following studies were reviewed today:   EKG:   12/23/2023: Normal sinus rhythm, rate 93, no ST abnormalities 01/12/2022: Normal sinus rhythm, rate 75, no ST abnormalities 07/03/2021: Normal sinus rhythm, rate 87, less than 1 mm ST depressions in leads II, aVF,  V5-6  Recent Labs: No results found for requested labs within last 365 days.  Recent Lipid Panel    Component Value Date/Time   CHOL 117 01/12/2022 0909   TRIG 82 01/12/2022 0909   HDL 36 (L) 01/12/2022 0909   CHOLHDL 3.3 01/12/2022 0909   LDLCALC 65 01/12/2022 0909    Physical Exam:    VS:  BP (!) 155/80 (BP Location: Left Arm, Patient Position: Sitting, Cuff Size: Normal)   Pulse 95   Resp 16   Ht 5' 6 (1.676 m)   Wt 142 lb 2 oz (64.5 kg)   SpO2 98%   BMI 22.94 kg/m     Wt Readings from Last 3 Encounters:  12/23/23 142 lb 2 oz (64.5 kg)  04/28/23 135 lb (61.2 kg)  03/29/23 135 lb (61.2 kg)     GEN:  Well nourished, well developed in no acute distress HEENT: Normal NECK: No JVD; No carotid bruits LYMPHATICS: No lymphadenopathy CARDIAC: RRR, no murmurs, rubs, gallops RESPIRATORY:  Clear to auscultation without rales, wheezing or  rhonchi  ABDOMEN: Soft, non-tender, non-distended MUSCULOSKELETAL:  No edema; No deformity  SKIN: Warm and dry NEUROLOGIC:  Alert and oriented x 3 PSYCHIATRIC:  Normal affect   ASSESSMENT:    1. Chest pain of uncertain etiology   2. Coronary artery disease involving native coronary artery of native heart, unspecified whether angina present   3. Essential hypertension   4. Hypercholesterolemia       PLAN:    Chest pain/dyspnea on exertion: Description concerning for angina, as describes burning chest pain that occurs with exertion and relieved with rest.  Echocardiogram on 01/15/2021 showed normal biventricular function, no significant valvular disease.  Coronary CTA on 01/08/2021 showed calcium  score 311 (89th percentile), nonobstructive CAD. -Given  description suggestive of angina with nonobstructive disease on coronary CT, suspect microvascular disease.  Started on metoprolol  25 mg twice daily with significant improvement in her chest pain, but she reported visual hallucinations and stopped metoprolol  - She is reporting worsening chest pain and dyspnea recently.  Recommend stress PET to evaluate for new obstructive CAD or microvascular disease.  CAD: Coronary CTA on 01/08/2021 showed calcium  score 311 (89th percentile), nonobstructive CAD. -Continue aspirin 81 mg daily -Continue rosuvastatin  20 mg daily.    Hypertension: On losartan 100 mg daily and diltiazem  120 mg daily.  BP elevated, recommend switching from diltiazem  to amlodipine  5 mg daily.  Asked to check BP daily for next 2 weeks and let us  know results  Hyperlipidemia: On rosuvastatin  20 mg daily, LDL 61 on 08/12/2023  T2DM: On metformin .  Hemoglobin A1c 6.8% on 07/22/21.  Has improved to 5.7% on 07/2023  RTC in 4 months  Informed Consent   Shared Decision Making/Informed Consent The risks [chest pain, shortness of breath, cardiac arrhythmias, dizziness, blood pressure fluctuations, myocardial infarction, stroke/transient  ischemic attack, nausea, vomiting, allergic reaction, radiation exposure, metallic taste sensation and life-threatening complications (estimated to be 1 in 10,000)], benefits (risk stratification, diagnosing coronary artery disease, treatment guidance) and alternatives of a cardiac PET stress test were discussed in detail with Ms. Mullaly and she agrees to proceed.       Medication Adjustments/Labs and Tests Ordered: Current medicines are reviewed at length with the patient today.  Concerns regarding medicines are outlined above.  Orders Placed This Encounter  Procedures   NM PET CT CARDIAC PERFUSION MULTI W/ABSOLUTE BLOODFLOW   EKG 12-Lead    Meds ordered this encounter  Medications   amLODipine  (NORVASC ) 5 MG tablet    Sig: Take 1 tablet (5 mg total) by mouth daily.    Dispense:  90 tablet    Refill:  3     Patient Instructions  Medication Instructions:  Stop Diltiazem  Start Amlodipine  5 mg daily Continue all other medications *If you need a refill on your cardiac medications before your next appointment, please call your pharmacy*  Lab Work: None ordered  Testing/Procedures: Cardiac Pet Scan will be scheduled at [facility] after approved by insurance   Follow instructions below  Follow-Up: At [facility], you and your health needs are our priority.  As part of our continuing mission to provide you with exceptional heart care, our providers are all part of one team.  This team includes your primary Cardiologist (physician) and Advanced Practice Providers or APPs (Physician Assistants and Nurse Practitioners) who all work together to provide you with the care you need, when you need it.  Your next appointment:  4 months    Monday 12/1 at 8:20 am    Provider:  Dr.Latonya Knight  \     Please report to Radiology at the [facility] Main Entrance 30 minutes early for your test.  [address] [place], [place] [address]                         OR    Please report to Radiology at [facility] Main Entrance, medical mall, 30 mins prior to your test.  [address]  [place], [place]  How to Prepare for Your Cardiac PET/CT Stress Test:  Nothing to eat or drink, except water, 3 hours prior to arrival time.  NO caffeine/decaffeinated products, or chocolate 12 hours prior to arrival. (  Please note decaffeinated beverages (teas/coffees) still contain caffeine).  If you have caffeine within 12 hours prior, the test will need to be rescheduled.  Medication instructions: Do not take nitrates (isosorbide mononitrate, Ranexa) the day before or day of test Do not take tamsulosin the day before or morning of test Hold theophylline containing medications for 12 hours. Hold Dipyridamole 48 hours prior to the test.  Diabetic Preparation: If able to eat breakfast prior to 3 hour fasting, you may take all medications, including your insulin . Do not worry if you miss your breakfast dose of insulin  - start at your next meal. If you do not eat prior to 3 hour fast-Hold all diabetes (oral and insulin ) medications. Patients who wear a continuous glucose monitor MUST remove the device prior to scanning.  You may take your remaining medications with water.  NO perfume, cologne or lotion on chest or abdomen area. FEMALES - Please avoid wearing dresses to this appointment.  Total time is 1 to 2 hours; you may want to bring reading material for the waiting time.    In preparation for your appointment, medication and supplies will be purchased.  Appointment availability is limited, so if you need to cancel or reschedule, please call the Radiology Department Scheduler at [phone] 24 hours in advance to avoid a cancellation fee of $100.00  What to Expect When you Arrive:  Once you arrive and check in for your appointment, you will be taken to a preparation room within the Radiology Department.  A technologist or Nurse will  obtain your medical history, verify that you are correctly prepped for the exam, and explain the procedure.  Afterwards, an IV will be started in your arm and electrodes will be placed on your skin for EKG monitoring during the stress portion of the exam. Then you will be escorted to the PET/CT scanner.  There, staff will get you positioned on the scanner and obtain a blood pressure and EKG.  During the exam, you will continue to be connected to the EKG and blood pressure machines.  A small, safe amount of a radioactive tracer will be injected in your IV to obtain a series of pictures of your heart along with an injection of a stress agent.    After your Exam:  It is recommended that you eat a meal and drink a caffeinated beverage to counter act any effects of the stress agent.  Drink plenty of fluids for the remainder of the day and urinate frequently for the first couple of hours after the exam.  Your doctor will inform you of your test results within 7-10 business days.  For more information and frequently asked questions, please visit our website: [email or web address]  For questions about your test or how to prepare for your test, please call: Cardiac Imaging Nurse Navigators Office: [phone]  We recommend signing up for the patient portal called MyChart.  Sign up information is provided on this After Visit Summary.  MyChart is used to connect with patients for Virtual Visits (Telemedicine).  Patients are able to view lab/test results, encounter notes, upcoming appointments, etc.  Non-urgent messages can be sent to your provider as well.   To learn more about what you can do with MyChart, go to [email or web address].   Check blood pressure daily for 2 weeks  Send readings to Mychart    Signed, Lonni LITTIE Nanas, MD  12/23/2023 9:38 AM    [facility]

## 2023-12-23 ENCOUNTER — Ambulatory Visit: Attending: Cardiology | Admitting: Cardiology

## 2023-12-23 ENCOUNTER — Encounter: Payer: Self-pay | Admitting: Cardiology

## 2023-12-23 VITALS — BP 155/80 | HR 95 | Resp 16 | Ht 66.0 in | Wt 142.1 lb

## 2023-12-23 DIAGNOSIS — E7800 Pure hypercholesterolemia, unspecified: Secondary | ICD-10-CM | POA: Diagnosis not present

## 2023-12-23 DIAGNOSIS — I2510 Atherosclerotic heart disease of native coronary artery without angina pectoris: Secondary | ICD-10-CM | POA: Diagnosis not present

## 2023-12-23 DIAGNOSIS — I10 Essential (primary) hypertension: Secondary | ICD-10-CM | POA: Diagnosis not present

## 2023-12-23 DIAGNOSIS — R079 Chest pain, unspecified: Secondary | ICD-10-CM

## 2023-12-23 MED ORDER — AMLODIPINE BESYLATE 5 MG PO TABS: 5.0000 mg | ORAL_TABLET | Freq: Every day | ORAL | 3 refills | Status: AC

## 2023-12-23 NOTE — Patient Instructions (Addendum)
 Medication Instructions:  Stop Diltiazem  Start Amlodipine  5 mg daily Continue all other medications *If you need a refill on your cardiac medications before your next appointment, please call your pharmacy*  Lab Work: None ordered  Testing/Procedures: Cardiac Pet Scan will be scheduled at [facility] after approved by insurance   Follow instructions below  Follow-Up: At [facility], you and your health needs are our priority.  As part of our continuing mission to provide you with exceptional heart care, our providers are all part of one team.  This team includes your primary Cardiologist (physician) and Advanced Practice Providers or APPs (Physician Assistants and Nurse Practitioners) who all work together to provide you with the care you need, when you need it.  Your next appointment:  4 months    Monday 12/1 at 8:20 am    Provider:  Dr.Schumann  \     Please report to Radiology at the [facility] Main Entrance 30 minutes early for your test.  [address] [place], [place] [address]                         OR   Please report to Radiology at [facility] Main Entrance, medical mall, 30 mins prior to your test.  [address]  [place], [place]  How to Prepare for Your Cardiac PET/CT Stress Test:  Nothing to eat or drink, except water, 3 hours prior to arrival time.  NO caffeine/decaffeinated products, or chocolate 12 hours prior to arrival. (Please note decaffeinated beverages (teas/coffees) still contain caffeine).  If you have caffeine within 12 hours prior, the test will need to be rescheduled.  Medication instructions: Do not take nitrates (isosorbide mononitrate, Ranexa) the day before or day of test Do not take tamsulosin the day before or morning of test Hold theophylline containing medications for 12 hours. Hold Dipyridamole 48 hours prior to the test.  Diabetic Preparation: If able to eat breakfast prior to 3 hour  fasting, you may take all medications, including your insulin . Do not worry if you miss your breakfast dose of insulin  - start at your next meal. If you do not eat prior to 3 hour fast-Hold all diabetes (oral and insulin ) medications. Patients who wear a continuous glucose monitor MUST remove the device prior to scanning.  You may take your remaining medications with water.  NO perfume, cologne or lotion on chest or abdomen area. FEMALES - Please avoid wearing dresses to this appointment.  Total time is 1 to 2 hours; you may want to bring reading material for the waiting time.    In preparation for your appointment, medication and supplies will be purchased.  Appointment availability is limited, so if you need to cancel or reschedule, please call the Radiology Department Scheduler at [phone] 24 hours in advance to avoid a cancellation fee of $100.00  What to Expect When you Arrive:  Once you arrive and check in for your appointment, you will be taken to a preparation room within the Radiology Department.  A technologist or Nurse will obtain your medical history, verify that you are correctly prepped for the exam, and explain the procedure.  Afterwards, an IV will be started in your arm and electrodes will be placed on your skin for EKG monitoring during the stress portion of the exam. Then you will be escorted to the PET/CT scanner.  There, staff will get you positioned on the scanner and obtain  a blood pressure and EKG.  During the exam, you will continue to be connected to the EKG and blood pressure machines.  A small, safe amount of a radioactive tracer will be injected in your IV to obtain a series of pictures of your heart along with an injection of a stress agent.    After your Exam:  It is recommended that you eat a meal and drink a caffeinated beverage to counter act any effects of the stress agent.  Drink plenty of fluids for the remainder of the day and urinate frequently for the  first couple of hours after the exam.  Your doctor will inform you of your test results within 7-10 business days.  For more information and frequently asked questions, please visit our website: [email or web address]  For questions about your test or how to prepare for your test, please call: Cardiac Imaging Nurse Navigators Office: [phone]  We recommend signing up for the patient portal called MyChart.  Sign up information is provided on this After Visit Summary.  MyChart is used to connect with patients for Virtual Visits (Telemedicine).  Patients are able to view lab/test results, encounter notes, upcoming appointments, etc.  Non-urgent messages can be sent to your provider as well.   To learn more about what you can do with MyChart, go to [email or web address].   Check blood pressure daily for 2 weeks  Send readings to Mychart

## 2023-12-26 DIAGNOSIS — K1379 Other lesions of oral mucosa: Secondary | ICD-10-CM | POA: Diagnosis not present

## 2023-12-26 DIAGNOSIS — I10 Essential (primary) hypertension: Secondary | ICD-10-CM | POA: Diagnosis not present

## 2023-12-26 DIAGNOSIS — R0981 Nasal congestion: Secondary | ICD-10-CM | POA: Diagnosis not present

## 2023-12-27 ENCOUNTER — Other Ambulatory Visit: Payer: Self-pay | Admitting: Adult Health

## 2024-01-24 ENCOUNTER — Ambulatory Visit (INDEPENDENT_AMBULATORY_CARE_PROVIDER_SITE_OTHER)

## 2024-01-24 ENCOUNTER — Encounter (INDEPENDENT_AMBULATORY_CARE_PROVIDER_SITE_OTHER): Payer: Self-pay

## 2024-01-24 VITALS — BP 125/83 | HR 98

## 2024-01-24 DIAGNOSIS — K219 Gastro-esophageal reflux disease without esophagitis: Secondary | ICD-10-CM | POA: Diagnosis not present

## 2024-01-24 DIAGNOSIS — J310 Chronic rhinitis: Secondary | ICD-10-CM

## 2024-01-24 DIAGNOSIS — K1379 Other lesions of oral mucosa: Secondary | ICD-10-CM | POA: Diagnosis not present

## 2024-01-24 DIAGNOSIS — Z87891 Personal history of nicotine dependence: Secondary | ICD-10-CM

## 2024-01-24 MED ORDER — IPRATROPIUM BROMIDE 0.06 % NA SOLN: 1.0000 | Freq: Three times a day (TID) | NASAL | 12 refills | Status: DC | PRN

## 2024-01-24 NOTE — Progress Notes (Signed)
 BP was high first time.  Patient explained that she gets exerted while walking because she has a heart issue. BP was within normal range second time.

## 2024-01-24 NOTE — Progress Notes (Signed)
 Dear Dr. Kip, Here is my assessment for our mutual patient, Cristy Colmenares. Thank you for allowing me the opportunity to care for your patient. Please do not hesitate to contact me should you have any other questions. Sincerely, Dr. Penne Croak  Otolaryngology Clinic Note Referring provider: Dr. Kip HPI:  Margaret Long is a 73 y.o. female kindly referred by Dr. Kip for evaluation of nasal congestion, rhinorrhea, and episodes of bleeding while gargling.   Patient recalls trace amounts of blood when gargling as a long-standing issue that has been intermittently present for at least 7 years. This morning, they noticed a trace amount again but had to look closely to see it. However, the patient describes experiencing a significant episode of bright red bleeding, which prompted today's visit.  Past smoking history includes approximately 20 years of smoking (less than one pack per day), with cessation in 2005. No history of imaging or throat evaluation with a camera was reported.   Additionally, the patient experiences issues with a runny nose, particularly when eating, which they find bothersome. This started a few years ago and has progressively worsened. No salty or metallic taste. No gush of fluid upon wakening.   The patient also indicated that their lower denture is too large, requiring the use of a wafer for adjustments. They acknowledge occasional discomfort or abrasions from the denture, particularly on the left side, which typically resolves by removing the denture for a few days. She is scheduled to see her dentist/oral surgeon next month for a denture adjustment.   No weight loss, throat pain, coughing up blood, dysphagia, voice changes. Hx of GERD, takes famotididne.   Social History   Tobacco Use   Smoking status: Former    Current packs/day: 0.00    Types: Cigarettes    Quit date: 05/2003    Years since quitting: 20.6   Smokeless tobacco: Never  Substance Use Topics    Alcohol use: No   Drug use: No    Independent Review of Additional Tests or Records:    PMH/Meds/All/SocHx/FamHx/ROS:   Past Medical History:  Diagnosis Date   Allergic rhinitis 08/25/2015   Carpal tunnel syndrome 08/25/2015   Essential hypertension 06/10/2015   Gastroesophageal reflux disease without esophagitis 08/25/2015   Hardening of the aorta (main artery of the heart) 08/03/2021   Hypercholesterolemia 07/29/2014   Hyperlipemia 08/25/2015   Insomnia 07/30/2014   Major depressive disorder 07/30/2014   Osteopenia 08/25/2015   Osteoporosis 08/25/2015   Postablative hypothyroidism 07/29/2014   Trochanteric bursitis of right hip 06/19/2018   Type 2 diabetes mellitus without complication, without long-term current use of insulin  07/29/2014   Visual hallucinations 10/22/2022   Vitamin B12 deficiency 08/19/2017   Vitamin D deficiency 07/30/2014     History reviewed. No pertinent surgical history.  Family History  Problem Relation Age of Onset   Dementia Mother        unspecified type   Dementia Sister        unspecified type     Social Connections: Not on file      Current Outpatient Medications:    amLODipine  (NORVASC ) 5 MG tablet, Take 1 tablet (5 mg total) by mouth daily., Disp: 90 tablet, Rfl: 3   aspirin 81 MG EC tablet, Take 1 tablet by mouth daily., Disp: , Rfl:    famotidine (PEPCID) 20 MG tablet, Take by mouth., Disp: , Rfl:    ferrous sulfate 324 MG TBEC, Take 324 mg by mouth., Disp: , Rfl:  levothyroxine (SYNTHROID) 75 MCG tablet, Take 1 tablet by mouth daily., Disp: , Rfl:    losartan (COZAAR) 100 MG tablet, 1 tablet daily., Disp: , Rfl:    metFORMIN  (GLUCOPHAGE ) 1000 MG tablet, 1 tablet 2 (two) times daily with a meal., Disp: , Rfl:    nitroGLYCERIN  (NITROSTAT ) 0.4 MG SL tablet, PLACE 1 TABLET UNDER TONGUE EVERY 5 MINUTES AS NEEDED FOR CHEST PAIN, Disp: 25 tablet, Rfl: 10   rosuvastatin  (CRESTOR ) 20 MG tablet, TAKE 1 TABLET(20 MG) BY MOUTH DAILY,  Disp: 90 tablet, Rfl: 3   Cholecalciferol 25 MCG (1000 UT) capsule, Take 1,000 Units by mouth daily., Disp: , Rfl:    ipratropium (ATROVENT ) 0.06 % nasal spray, Place 1 spray into both nostrils 3 (three) times daily with meals as needed for rhinitis., Disp: 15 mL, Rfl: 12   Physical Exam:   BP 125/83   Pulse 98   SpO2 95%   Salient findings:  CN II-XII intact  Bilateral EAC clear and TM intact with well pneumatized middle ear spaces. Small amount of cerumen right EAC. Patient declined removal.  Oral cavity: Normal palate elevation. No masses.  Examination of the lower revealed a small abrasion left FOM, likely caused by denture rub.  - No significant pain or unusual findings noted in the oral cavity. Anterior rhinoscopy: Septum mild deviation left; bilateral inferior turbinates with 2+ edema No lesions of oral cavity/oropharynx; dentition- edentulous.  No obviously palpable neck masses/lymphadenopathy/thyromegaly No respiratory distress or stridor  Seprately Identifiable Procedures:  Procedure Note Diagnostic Nasal Endoscopy CPT CODE -- 68768 - Mod 25  Prior to initiating any procedures, risks/benefits/alternatives were explained to the patient and verbal consent obtained.  Given the patient's symptoms and incomplete visualization of critical sinonasal areas with anterior rhinoscopy, a separately performed diagnostic nasal endoscopy procedure is indicated for a complete rhinologic evaluation per American Rhinologic Society recommendations (https://www.american-rhinologic.org/position-statements)  Pre-procedure diagnosis: rhinitis, rule out mass/ominous etiology Post-procedure diagnosis: same Indication: See pre-procedure diagnosis and physical exam above Complications: None apparent EBL: 0 mL Anesthesia: Lidocaine 4% and topical decongestant was topically sprayed in each nasal cavity  Description of Procedure:  Patient was identified. A flexible fiberoptic endoscope was utilized to  evaluate the sinonasal cavities, mucosa, sinus ostia and turbinates and septum.  Overall, signs of mucosal inflammation are noted bilaterally.  Also noted are no mucopurulence, polyps, or masses noted. No evidence of CSF leak. Right Middle meatus: Clear Right SE Recess: Clear Left MM: Clear Left SE Recess: Clear Photodocumentation was obtained but unable to save secondary to absent USB drive.  Procedure Note Pre-procedure diagnosis:  oral bleeding  Post-procedure diagnosis: Same Procedure: Transnasal Fiberoptic Laryngoscopy, CPT 31575 - Mod 59 Indication: none Complications: None apparent EBL: 0 mL  The procedure was undertaken to further evaluate the patient's complaint of recurrent oral bleeding and history of tobacco use.  The procedure was done to rule out upper airway bleeding that was not able to be visualized on oral exam.  mirror exam was inadequate for appropriate examination due to gag reflex and poor patient tolerance  Procedure:  Patient was identified as correct patient. Verbal consent was obtained. The nose was sprayed with oxymetazoline and 4% lidocaine. The The flexible laryngoscope was passed through the nose to view the nasal cavity, pharynx (oropharynx, hypopharynx) and larynx.  The larynx was examined at rest and during multiple phonatory tasks. Documentation was obtained and reviewed with patient. The scope was removed. The patient tolerated the procedure well.  Findings: The tongue base,  pharyngeal walls, piriform sinuses, vallecula, epiglottis and postcricoid region are normal in appearance.. The visualized portion of the subglottis and proximal trachea is widely patent. There was mild post cricoid edema. The vocal folds are mobile bilaterally. There are no lesions on the free edge of the vocal folds nor elsewhere in the larynx worrisome for malignancy.    Electronically signed by: Penne Croak, DO 01/24/2024 4:44 PM   Impression & Plans:  Margaret Long is a 73 y.o.  female with progressive gustatory rhinorrhea for several months and recurrent oral bleeding.   1. Oral bleeding   2. Gustatory rhinitis   3. Rhinitis, nonallergic, chronic   4. History of tobacco use   5. LPRD (laryngopharyngeal reflux disease)    Plan - today's findings were discussed in detail with the patient - the above diagnoses were discussed with the patient - Oral cavity with abrasion left floor of mouth roughly 5 mm in length, healing well, no ulceration or lesion evident.  She sometimes will use a wafer that will improve discomfort.  Patient reports lower denture is too large and has appointment with her oral surgeon/dentist. Normal visualization of larynx and hypopharynx. No evidence of malignancy on today's exam.  Recommend follow-up in 1 month for reevaluation. -Discussed causes of gustatory rhinorrhea.  Discussed observation versus medical management.  Patient elects to proceed with nasal spray.  Will reevaluate in 1 month  - f/u 1 month, recheck lesion, f/u on med rx  See below regarding exact medications prescribed this encounter including dosages and route: Meds ordered this encounter  Medications   DISCONTD: ipratropium (ATROVENT ) 0.06 % nasal spray    Sig: Place 1 spray into both nostrils 3 (three) times daily with meals as needed for rhinitis.    Dispense:  15 mL    Refill:  12   DISCONTD: ipratropium (ATROVENT ) 0.06 % nasal spray    Sig: Place 1 spray into both nostrils 3 (three) times daily with meals as needed for rhinitis.    Dispense:  15 mL    Refill:  12   ipratropium (ATROVENT ) 0.06 % nasal spray    Sig: Place 1 spray into both nostrils 3 (three) times daily with meals as needed for rhinitis.    Dispense:  15 mL    Refill:  12   Thank you for allowing me the opportunity to care for your patient. Please do not hesitate to contact me should you have any other questions.  Sincerely, Penne Croak, DO Otolaryngologist (ENT) [facility] Phone: [phone] Fax: [phone]  01/24/2024, 4:44 PM   MDM:  Level 99204- mod 25 Complexity/Problems addressed: low- new gustatory rhinorrhea Data complexity: limited  - Morbidity: rx  - Prescription Drug prescribed or managed: see above   Diagnostic Nasal Endoscopy CPT CODE -- 68768 - Mod 25 Transnasal Fiberoptic Laryngoscopy, CPT 31575 - Mod 59  I spent 45 minutes (exclusive of separately billable procedures) on the day of the encounter reviewing the patient's chart, seeing the patient face to face, discussing the findings and the treatment plan, and documenting in the EHR.

## 2024-01-25 ENCOUNTER — Encounter: Payer: Self-pay | Admitting: Cardiology

## 2024-02-17 ENCOUNTER — Encounter (HOSPITAL_COMMUNITY): Payer: Self-pay

## 2024-02-17 DIAGNOSIS — E2839 Other primary ovarian failure: Secondary | ICD-10-CM | POA: Diagnosis not present

## 2024-02-17 DIAGNOSIS — E1169 Type 2 diabetes mellitus with other specified complication: Secondary | ICD-10-CM | POA: Diagnosis not present

## 2024-02-17 DIAGNOSIS — E039 Hypothyroidism, unspecified: Secondary | ICD-10-CM | POA: Diagnosis not present

## 2024-02-17 DIAGNOSIS — E785 Hyperlipidemia, unspecified: Secondary | ICD-10-CM | POA: Diagnosis not present

## 2024-02-17 DIAGNOSIS — I10 Essential (primary) hypertension: Secondary | ICD-10-CM | POA: Diagnosis not present

## 2024-02-17 DIAGNOSIS — D649 Anemia, unspecified: Secondary | ICD-10-CM | POA: Diagnosis not present

## 2024-02-21 ENCOUNTER — Encounter (HOSPITAL_COMMUNITY)
Admission: RE | Admit: 2024-02-21 | Discharge: 2024-02-21 | Disposition: A | Discharge status: Home / Self Care | Source: Ambulatory Visit | Attending: Cardiology | Admitting: Cardiology

## 2024-02-21 DIAGNOSIS — E7800 Pure hypercholesterolemia, unspecified: Secondary | ICD-10-CM | POA: Diagnosis not present

## 2024-02-21 DIAGNOSIS — I2510 Atherosclerotic heart disease of native coronary artery without angina pectoris: Secondary | ICD-10-CM | POA: Diagnosis not present

## 2024-02-21 DIAGNOSIS — R079 Chest pain, unspecified: Secondary | ICD-10-CM | POA: Insufficient documentation

## 2024-02-21 DIAGNOSIS — I10 Essential (primary) hypertension: Secondary | ICD-10-CM | POA: Diagnosis not present

## 2024-02-21 LAB — NM PET CT CARDIAC PERFUSION MULTI W/ABSOLUTE BLOODFLOW
LV dias vol: 53 mL (ref 46–106)
LV sys vol: 15 mL (ref 3.8–5.2)
MBFR: 2.25
Nuc Rest EF: 72 %
Nuc Stress EF: 77 %
Rest MBF: 1.21 ml/g/min
Rest Nuclear Isotope Dose: 16.7 mCi
ST Depression (mm): 0 mm
Stress MBF: 2.72 ml/g/min
Stress Nuclear Isotope Dose: 16.7 mCi

## 2024-02-21 MED ORDER — RUBIDIUM RB82 GENERATOR (RUBYFILL)
16.7100 | PACK | Freq: Once | INTRAVENOUS | Status: AC
  Administered 2024-02-21: 16.71 via INTRAVENOUS

## 2024-02-21 MED ORDER — RUBIDIUM RB82 GENERATOR (RUBYFILL)
16.7000 | PACK | Freq: Once | INTRAVENOUS | Status: AC
  Administered 2024-02-21: 16.7 via INTRAVENOUS

## 2024-02-21 MED ORDER — REGADENOSON 0.4 MG/5ML IV SOLN
INTRAVENOUS | Status: AC
  Filled 2024-02-21: qty 5

## 2024-02-21 MED ORDER — REGADENOSON 0.4 MG/5ML IV SOLN
0.4000 mg | Freq: Once | INTRAVENOUS | Status: AC
  Administered 2024-02-21: 0.4 mg via INTRAVENOUS

## 2024-02-21 NOTE — Progress Notes (Signed)
 Pt toelrated lexiscan. Endorsed some SOB, resolved by end of scan. HA at end of exam, given PO caffeine. All questions answered by this RN and understanding verbalized.

## 2024-02-22 ENCOUNTER — Ambulatory Visit: Payer: Self-pay | Admitting: Cardiology

## 2024-02-27 ENCOUNTER — Encounter (INDEPENDENT_AMBULATORY_CARE_PROVIDER_SITE_OTHER): Payer: Self-pay

## 2024-02-27 ENCOUNTER — Ambulatory Visit (INDEPENDENT_AMBULATORY_CARE_PROVIDER_SITE_OTHER)

## 2024-02-27 VITALS — BP 137/84 | HR 78 | Temp 98.3°F

## 2024-02-27 DIAGNOSIS — J310 Chronic rhinitis: Secondary | ICD-10-CM | POA: Diagnosis not present

## 2024-02-27 DIAGNOSIS — K219 Gastro-esophageal reflux disease without esophagitis: Secondary | ICD-10-CM | POA: Diagnosis not present

## 2024-02-27 DIAGNOSIS — K1379 Other lesions of oral mucosa: Secondary | ICD-10-CM | POA: Diagnosis not present

## 2024-02-27 DIAGNOSIS — R070 Pain in throat: Secondary | ICD-10-CM | POA: Diagnosis not present

## 2024-02-27 DIAGNOSIS — Z87891 Personal history of nicotine dependence: Secondary | ICD-10-CM | POA: Diagnosis not present

## 2024-02-27 MED ORDER — OMEPRAZOLE 20 MG PO CPDR: 20.0000 mg | DELAYED_RELEASE_CAPSULE | Freq: Two times a day (BID) | ORAL | 2 refills | Status: DC

## 2024-02-27 NOTE — Progress Notes (Signed)
 Dear Dr. Kip, Here is my assessment for our mutual patient, Margaret Long. Thank you for allowing me the opportunity to care for your patient. Please do not hesitate to contact me should you have any other questions. Sincerely, Dr. Penne Croak  Otolaryngology Clinic Note Referring provider: Dr. Kip HPI:   History of Present Illness Margaret Long is a 73 year old female who presents with throat discomfort and bleeding episodes.  Oropharyngeal bleeding and discomfort - Episodes of oral bleeding, described as 'pinkish' rather than bright red, primarily when rinsing mouth in the morning - Throat discomfort, particularly on the left side, with sensation of throat being 'a little closed off' - Minor odynophagia present - Discomfort attributed in part to improperly lined dentures; has consulted with dentist regarding this issue - No changes in voice - No significant weight loss currently; previous weight loss of twenty pounds due to thyroid  issues, with subsequent regain of ten pounds  Rhinorrhea associated with eating - Runny nose occurs when eating - Symptom managed effectively with as-needed nasal spray  Gastroesophageal reflux symptoms - Gastroesophageal reflux symptoms occur once or twice a week, typically after eating and then lying down - Symptoms managed with as-needed Pepcid  Tobacco use history - History of smoking; quit twenty years ago  Thyroid  dysfunction - On thyroid  medication, taken at 6 AM daily - Previous weight loss of twenty pounds attributed to thyroid  issues, with subsequent regain of ten pounds  Independent Review of Additional Tests or Records:  none  PMH/Meds/All/SocHx/FamHx/ROS:   Past Medical History:  Diagnosis Date   Allergic rhinitis 08/25/2015   Carpal tunnel syndrome 08/25/2015   Essential hypertension 06/10/2015   Gastroesophageal reflux disease without esophagitis 08/25/2015   Hardening of the aorta (main artery of the heart)  08/03/2021   Hypercholesterolemia 07/29/2014   Hyperlipemia 08/25/2015   Insomnia 07/30/2014   Major depressive disorder 07/30/2014   Osteopenia 08/25/2015   Osteoporosis 08/25/2015   Postablative hypothyroidism 07/29/2014   Trochanteric bursitis of right hip 06/19/2018   Type 2 diabetes mellitus without complication, without long-term current use of insulin  07/29/2014   Visual hallucinations 10/22/2022   Vitamin B12 deficiency 08/19/2017   Vitamin D deficiency 07/30/2014     History reviewed. No pertinent surgical history.  Family History  Problem Relation Age of Onset   Dementia Mother        unspecified type   Dementia Sister        unspecified type     Social Connections: Not on file      Current Outpatient Medications:    omeprazole (PRILOSEC) 20 MG capsule, Take 1 capsule (20 mg total) by mouth 2 (two) times daily before a meal., Disp: 60 capsule, Rfl: 2   amLODipine  (NORVASC ) 5 MG tablet, Take 1 tablet (5 mg total) by mouth daily., Disp: 90 tablet, Rfl: 3   aspirin 81 MG EC tablet, Take 1 tablet by mouth daily., Disp: , Rfl:    Cholecalciferol 25 MCG (1000 UT) capsule, Take 1,000 Units by mouth daily., Disp: , Rfl:    famotidine (PEPCID) 20 MG tablet, Take by mouth., Disp: , Rfl:    ferrous sulfate 324 MG TBEC, Take 324 mg by mouth., Disp: , Rfl:    ipratropium (ATROVENT ) 0.06 % nasal spray, Place 1 spray into both nostrils 3 (three) times daily with meals as needed for rhinitis., Disp: 15 mL, Rfl: 12   levothyroxine (SYNTHROID) 75 MCG tablet, Take 1 tablet by mouth daily., Disp: , Rfl:  losartan (COZAAR) 100 MG tablet, 1 tablet daily., Disp: , Rfl:    metFORMIN  (GLUCOPHAGE ) 1000 MG tablet, 1 tablet 2 (two) times daily with a meal., Disp: , Rfl:    nitroGLYCERIN  (NITROSTAT ) 0.4 MG SL tablet, PLACE 1 TABLET UNDER TONGUE EVERY 5 MINUTES AS NEEDED FOR CHEST PAIN, Disp: 25 tablet, Rfl: 10   rosuvastatin  (CRESTOR ) 20 MG tablet, TAKE 1 TABLET(20 MG) BY MOUTH DAILY, Disp:  90 tablet, Rfl: 3   Physical Exam:   BP 137/84   Pulse 78   Temp 98.3 F (36.8 C)   SpO2 96%   The patient was awake, alert, and appropriate. The external ears were inspected, and otoscopy was performed to evaluate the external auditory canals and tympanic membranes. The nasal cavity and septum were examined for mucosal changes, obstruction, or discharge. The oral cavity and oropharynx were inspected for mucosal lesions, infection, or tonsillar hypertrophy. The neck was palpated for lymphadenopathy, thyroid  abnormalities, or other masses. Cranial nerve function was grossly intact.  Pertinent Findings: Physical Exam HEENT: Normal oropharynx, tongue and sublingual area normal. NECK: Neck supple, no tenderness.   Seprately Identifiable Procedures:  I personally ordered, reviewed and interpreted the following with the patient today  Prior to initiating any procedures, risks/benefits/alternatives were explained to the patient and verbal consent obtained. None  Impression & Plans:  Margaret Long is a 73 y.o. female   1. Oral bleeding   2. Gustatory rhinitis   3. LPRD (laryngopharyngeal reflux disease)   4. History of tobacco use   5. Throat pain in adult     - Findings and diagnoses discussed in detail with the patient. - Risks, benefits, and alternatives were reviewed. Through shared decision making, the patient elects to proceed with below. - Orders placed: No orders of the defined types were placed in this encounter. - Assessment and Plan Assessment & Plan Oral bleeding and oral mucosal lesion Intermittent oral bleeding with no current bright red blood, pain, or discomfort. - Consult with dentist regarding adjustment or replacement of dentures.  Gustatory and chronic nonallergic rhinitis Gustatory rhinitis with intermittent postprandial rhinorrhea.  - Continue using ipratropium nasal spray as needed postprandially.  Laryngopharyngeal reflux disease (LPRD) Throat discomfort  and drainage possibly related to LPRD, with reflux occurring once or twice weekly. Discussed potential benefit of an 8-week trial of omeprazole. Considered medication interactions with levothyroxine. - Initiate omeprazole 20 mg twice daily for 8 weeks, 30 minutes before breakfast and dinner. - Adjust timing of levothyroxine to 5 AM to avoid interaction with omeprazole. - Re-evaluate symptoms after 8 weeks and consider further examination of the throat if symptoms persist.   - Medications prescribed/continued/adjusted:  Meds ordered this encounter  Medications   omeprazole (PRILOSEC) 20 MG capsule    Sig: Take 1 capsule (20 mg total) by mouth 2 (two) times daily before a meal.    Dispense:  60 capsule    Refill:  2   - Education materials provided to the patient. - Follow up: 8 weeks, likely FFL or imaging if symptoms persist. Patient instructed to return sooner or go to the ED if new/worsening symptoms develop.   Thank you for allowing me the opportunity to care for your patient. Please do not hesitate to contact me should you have any other questions.  Sincerely, Penne Croak, DO Otolaryngologist (ENT) [facility] Phone: [phone] Fax: [phone]  02/27/2024, 9:08 AM

## 2024-03-29 DIAGNOSIS — D649 Anemia, unspecified: Secondary | ICD-10-CM | POA: Diagnosis not present

## 2024-04-18 DIAGNOSIS — N281 Cyst of kidney, acquired: Secondary | ICD-10-CM | POA: Diagnosis not present

## 2024-04-18 DIAGNOSIS — R809 Proteinuria, unspecified: Secondary | ICD-10-CM | POA: Diagnosis not present

## 2024-04-18 DIAGNOSIS — E1122 Type 2 diabetes mellitus with diabetic chronic kidney disease: Secondary | ICD-10-CM | POA: Diagnosis not present

## 2024-04-18 DIAGNOSIS — I10 Essential (primary) hypertension: Secondary | ICD-10-CM | POA: Diagnosis not present

## 2024-04-18 DIAGNOSIS — N179 Acute kidney failure, unspecified: Secondary | ICD-10-CM | POA: Diagnosis not present

## 2024-04-20 NOTE — Progress Notes (Signed)
 Cardiology Office Note:    Date:  04/23/2024   ID:  Madden Piazza, DOB 1950/10/28, MRN [id number]  PCP:  Kip Righter, MD  Cardiologist:  Lonni LITTIE Nanas, MD  Electrophysiologist:  None   Referring MD: Kip Righter, MD   Chief Complaint  Patient presents with   Chest Pain    History of Present Illness:    Margaret Long is a 73 y.o. female with a hx of T2DM, hypertension, hyperlipidemia, hypothyroidism, former tobacco use who presents for follow-up.  She was referred by Dr. Douglass for evaluation of dyspnea on exertion, initially seen on 12/30/2020.  She reported that she has been having chest pain and dyspnea with minimal exertion.  Reports reliably has symptoms when she walks up stairs.  Describes burning in center of her chest and shortness of breath that resolves within 1 to 2 minutes of rest.  Feels lightheaded during episode, denies any syncope.  Denies any palpitations or lower extremity edema.  She smoked almost 1 pack/day for 36 years, quit in 2005.  Family history includes brother had multiple MIs, first in late 50s/early 21s, and sister has PPM.  Echocardiogram on 01/15/2021 showed normal biventricular function, no significant valvular disease.  Coronary CTA on 01/08/2021 showed calcium  score 311 (89th percentile), nonobstructive CAD.  Stress PET 02/21/2024 showed normal perfusion, normal myocardial blood flow reserve, EF 72%, moderate coronary calcifications.  Since last clinic visit, she reports she is doing okay.  Reported episode of chest pain when walking to her kitchen that lasted about 30 minutes.  Otherwise does not have any chest pain.  She typically does exercises but did not exercise last week.   Past Medical History:  Diagnosis Date   Allergic rhinitis 08/25/2015   Carpal tunnel syndrome 08/25/2015   Essential hypertension 06/10/2015   Gastroesophageal reflux disease without esophagitis 08/25/2015   Hardening of the aorta (main artery of the heart) 08/03/2021    Hypercholesterolemia 07/29/2014   Hyperlipemia 08/25/2015   Insomnia 07/30/2014   Major depressive disorder 07/30/2014   Osteopenia 08/25/2015   Osteoporosis 08/25/2015   Postablative hypothyroidism 07/29/2014   Trochanteric bursitis of right hip 06/19/2018   Type 2 diabetes mellitus without complication, without long-term current use of insulin  07/29/2014   Visual hallucinations 10/22/2022   Vitamin B12 deficiency 08/19/2017   Vitamin D deficiency 07/30/2014    No past surgical history on file.  Current Medications: Current Meds  Medication Sig   amLODipine  (NORVASC ) 5 MG tablet Take 1 tablet (5 mg total) by mouth daily.   aspirin 81 MG EC tablet Take 1 tablet by mouth daily.   famotidine (PEPCID) 20 MG tablet Take by mouth.   ferrous sulfate 324 MG TBEC Take 324 mg by mouth.   ipratropium (ATROVENT ) 0.06 % nasal spray Place 1 spray into both nostrils 3 (three) times daily with meals as needed for rhinitis.   levothyroxine (SYNTHROID) 75 MCG tablet Take 1 tablet by mouth daily.   losartan (COZAAR) 100 MG tablet 1 tablet daily.   metFORMIN  (GLUCOPHAGE ) 1000 MG tablet 1 tablet 2 (two) times daily with a meal.   omeprazole  (PRILOSEC) 20 MG capsule Take 1 capsule (20 mg total) by mouth 2 (two) times daily before a meal.   rosuvastatin  (CRESTOR ) 20 MG tablet TAKE 1 TABLET(20 MG) BY MOUTH DAILY   [DISCONTINUED] nitroGLYCERIN  (NITROSTAT ) 0.4 MG SL tablet PLACE 1 TABLET UNDER TONGUE EVERY 5 MINUTES AS NEEDED FOR CHEST PAIN     Allergies:   Lisinopril, Metoprolol , and Other  Social History   Socioeconomic History   Marital status: Widowed    Spouse name: Not on file   Number of children: 4   Years of education: 12   Highest education level: High school graduate  Occupational History   Occupation: Retired  Tobacco Use   Smoking status: Former    Current packs/day: 0.00    Types: Cigarettes    Quit date: 05/2003    Years since quitting: 20.9   Smokeless tobacco: Never   Substance and Sexual Activity   Alcohol use: No   Drug use: No   Sexual activity: Not on file  Other Topics Concern   Not on file  Social History Narrative   Right handed   Drinks decaffeine   Lives alone'   2nd floor apartment   retired   Social Drivers of Corporate Investment Banker Strain: Not on file  Food Insecurity: No Food Insecurity (07/27/2019)   Hunger Vital Sign    Worried About Running Out of Food in the Last Year: Never true    Ran Out of Food in the Last Year: Never true  Transportation Needs: No Transportation Needs (07/27/2019)   PRAPARE - Administrator, Civil Service (Medical): No    Lack of Transportation (Non-Medical): No  Physical Activity: Not on file  Stress: Not on file  Social Connections: Not on file     Family History: Family history includes brother had multiple MIs, first in late 50s/early 3s, and sister has PPM.  ROS:   Please see the history of present illness.     All other systems reviewed and are negative.  EKGs/Labs/Other Studies Reviewed:    The following studies were reviewed today:   EKG:   12/23/2023: Normal sinus rhythm, rate 93, no ST abnormalities 01/12/2022: Normal sinus rhythm, rate 75, no ST abnormalities 07/03/2021: Normal sinus rhythm, rate 87, less than 1 mm ST depressions in leads II, aVF,  V5-6  Recent Labs: No results found for requested labs within last 365 days.  Recent Lipid Panel    Component Value Date/Time   CHOL 117 01/12/2022 0909   TRIG 82 01/12/2022 0909   HDL 36 (L) 01/12/2022 0909   CHOLHDL 3.3 01/12/2022 0909   LDLCALC 65 01/12/2022 0909    Physical Exam:    VS:  BP 129/82 (BP Location: Left Arm, Patient Position: Sitting, Cuff Size: Normal)   Pulse 98   Ht 5' 7.2 (1.707 m)   Wt 146 lb 1.6 oz (66.3 kg)   SpO2 99%   BMI 22.75 kg/m     Wt Readings from Last 3 Encounters:  04/23/24 146 lb 1.6 oz (66.3 kg)  12/23/23 142 lb 2 oz (64.5 kg)  04/28/23 135 lb (61.2 kg)     GEN:   Well nourished, well developed in no acute distress HEENT: Normal NECK: No JVD; No carotid bruits LYMPHATICS: No lymphadenopathy CARDIAC: RRR, no murmurs, rubs, gallops RESPIRATORY:  Clear to auscultation without rales, wheezing or rhonchi  ABDOMEN: Soft, non-tender, non-distended MUSCULOSKELETAL:  No edema; No deformity  SKIN: Warm and dry NEUROLOGIC:  Alert and oriented x 3 PSYCHIATRIC:  Normal affect   ASSESSMENT:    1. Coronary artery disease involving native coronary artery of native heart without angina pectoris   2. Essential hypertension   3. Hypercholesterolemia        PLAN:    CAD: Reported chest pain concerning for angina, as describes burning chest pain that occurs with exertion and relieved  with rest.  Echocardiogram on 01/15/2021 showed normal biventricular function, no significant valvular disease.  Coronary CTA on 01/08/2021 showed calcium  score 311 (89th percentile), nonobstructive CAD.  Reported worsening chest pain and dyspnea, stress PET 02/21/2024 showed normal perfusion, normal myocardial blood flow reserve, EF 72%, moderate coronary calcifications. -Continue aspirin 81 mg daily -Continue rosuvastatin  20 mg daily.    Hypertension: On losartan 100 mg daily and amlodipine  5 mg daily.  Appears controlled  Hyperlipidemia: On rosuvastatin  20 mg daily, LDL 61 on 08/12/2023  T2DM: On metformin .  Hemoglobin A1c 6.8% on 07/22/21.  Has improved, most recent A1c 6.1% on 02/17/2024  RTC in 6 months    Medication Adjustments/Labs and Tests Ordered: Current medicines are reviewed at length with the patient today.  Concerns regarding medicines are outlined above.  No orders of the defined types were placed in this encounter.   Meds ordered this encounter  Medications   nitroGLYCERIN  (NITROSTAT ) 0.4 MG SL tablet    Sig: Place 1 tablet (0.4 mg total) under the tongue every 5 (five) minutes as needed for chest pain.    Dispense:  25 tablet    Refill:  3     Patient  Instructions  Medication Instructions:  Your physician recommends that you continue on your current medications as directed. Please refer to the Current Medication list given to you today.  *If you need a refill on your cardiac medications before your next appointment, please call your pharmacy*  Lab Work: none If you have labs (blood work) drawn today and your tests are completely normal, you will receive your results only by: MyChart Message (if you have MyChart) OR A paper copy in the mail If you have any lab test that is abnormal or we need to change your treatment, we will call you to review the results.  Testing/Procedures: none  Follow-Up: At [facility], you and your health needs are our priority.  As part of our continuing mission to provide you with exceptional heart care, our providers are all part of one team.  This team includes your primary Cardiologist (physician) and Advanced Practice Providers or APPs (Physician Assistants and Nurse Practitioners) who all work together to provide you with the care you need, when you need it.  Your next appointment:   6 month(s)  Provider:   Lonni LITTIE Nanas, MD    We recommend signing up for the patient portal called MyChart.  Sign up information is provided on this After Visit Summary.  MyChart is used to connect with patients for Virtual Visits (Telemedicine).  Patients are able to view lab/test results, encounter notes, upcoming appointments, etc.  Non-urgent messages can be sent to your provider as well.   To learn more about what you can do with MyChart, go to [email or web address].   Other Instructions none           Signed, Lonni LITTIE Nanas, MD  04/23/2024 10:28 AM    [facility]

## 2024-04-23 ENCOUNTER — Encounter: Payer: Self-pay | Admitting: Cardiology

## 2024-04-23 ENCOUNTER — Ambulatory Visit: Attending: Cardiology | Admitting: Cardiology

## 2024-04-23 VITALS — BP 129/82 | HR 98 | Ht 67.2 in | Wt 146.1 lb

## 2024-04-23 DIAGNOSIS — I10 Essential (primary) hypertension: Secondary | ICD-10-CM

## 2024-04-23 DIAGNOSIS — I2510 Atherosclerotic heart disease of native coronary artery without angina pectoris: Secondary | ICD-10-CM

## 2024-04-23 DIAGNOSIS — E7800 Pure hypercholesterolemia, unspecified: Secondary | ICD-10-CM | POA: Diagnosis not present

## 2024-04-23 MED ORDER — NITROGLYCERIN 0.4 MG SL SUBL: 0.4000 mg | SUBLINGUAL_TABLET | SUBLINGUAL | 3 refills | Status: AC | PRN

## 2024-04-23 NOTE — Patient Instructions (Signed)
 Medication Instructions:  Your physician recommends that you continue on your current medications as directed. Please refer to the Current Medication list given to you today.  *If you need a refill on your cardiac medications before your next appointment, please call your pharmacy*  Lab Work: none If you have labs (blood work) drawn today and your tests are completely normal, you will receive your results only by: MyChart Message (if you have MyChart) OR A paper copy in the mail If you have any lab test that is abnormal or we need to change your treatment, we will call you to review the results.  Testing/Procedures: none  Follow-Up: At [facility], you and your health needs are our priority.  As part of our continuing mission to provide you with exceptional heart care, our providers are all part of one team.  This team includes your primary Cardiologist (physician) and Advanced Practice Providers or APPs (Physician Assistants and Nurse Practitioners) who all work together to provide you with the care you need, when you need it.  Your next appointment:   6 month(s)  Provider:   Lonni LITTIE Nanas, MD    We recommend signing up for the patient portal called MyChart.  Sign up information is provided on this After Visit Summary.  MyChart is used to connect with patients for Virtual Visits (Telemedicine).  Patients are able to view lab/test results, encounter notes, upcoming appointments, etc.  Non-urgent messages can be sent to your provider as well.   To learn more about what you can do with MyChart, go to [email or web address].   Other Instructions none

## 2024-04-25 ENCOUNTER — Other Ambulatory Visit: Payer: Self-pay

## 2024-04-25 DIAGNOSIS — D509 Iron deficiency anemia, unspecified: Secondary | ICD-10-CM

## 2024-04-26 ENCOUNTER — Other Ambulatory Visit (HOSPITAL_COMMUNITY): Payer: Self-pay | Admitting: Pharmacist

## 2024-04-26 ENCOUNTER — Other Ambulatory Visit: Payer: Self-pay

## 2024-04-26 ENCOUNTER — Inpatient Hospital Stay

## 2024-04-26 ENCOUNTER — Inpatient Hospital Stay: Attending: Internal Medicine | Admitting: Internal Medicine

## 2024-04-26 VITALS — BP 112/68 | HR 100 | Temp 97.8°F | Resp 17 | Ht 67.2 in | Wt 145.0 lb

## 2024-04-26 DIAGNOSIS — D509 Iron deficiency anemia, unspecified: Secondary | ICD-10-CM | POA: Insufficient documentation

## 2024-04-26 DIAGNOSIS — E538 Deficiency of other specified B group vitamins: Secondary | ICD-10-CM | POA: Insufficient documentation

## 2024-04-26 DIAGNOSIS — D508 Other iron deficiency anemias: Secondary | ICD-10-CM

## 2024-04-26 LAB — CBC WITH DIFFERENTIAL (CANCER CENTER ONLY)
Abs Immature Granulocytes: 0.02 K/uL (ref 0.00–0.07)
Basophils Absolute: 0 K/uL (ref 0.0–0.1)
Basophils Relative: 0 %
Eosinophils Absolute: 0.1 K/uL (ref 0.0–0.5)
Eosinophils Relative: 1 %
HCT: 30.5 % — ABNORMAL LOW (ref 36.0–46.0)
Hemoglobin: 9.4 g/dL — ABNORMAL LOW (ref 12.0–15.0)
Immature Granulocytes: 0 %
Lymphocytes Relative: 30 %
Lymphs Abs: 1.5 K/uL (ref 0.7–4.0)
MCH: 26.4 pg (ref 26.0–34.0)
MCHC: 30.8 g/dL (ref 30.0–36.0)
MCV: 85.7 fL (ref 80.0–100.0)
Monocytes Absolute: 0.3 K/uL (ref 0.1–1.0)
Monocytes Relative: 5 %
Neutro Abs: 3 K/uL (ref 1.7–7.7)
Neutrophils Relative %: 64 %
Platelet Count: 375 K/uL (ref 150–400)
RBC: 3.56 MIL/uL — ABNORMAL LOW (ref 3.87–5.11)
RDW: 16.2 % — ABNORMAL HIGH (ref 11.5–15.5)
WBC Count: 4.8 K/uL (ref 4.0–10.5)
nRBC: 0 % (ref 0.0–0.2)

## 2024-04-26 LAB — IRON AND IRON BINDING CAPACITY (CC-WL,HP ONLY)
Iron: 29 ug/dL (ref 28–170)
Saturation Ratios: 7 % — ABNORMAL LOW (ref 10.4–31.8)
TIBC: 400 ug/dL (ref 250–450)
UIBC: 371 ug/dL

## 2024-04-26 LAB — CMP (CANCER CENTER ONLY)
ALT: <5 U/L (ref 0–44)
AST: 15 U/L (ref 15–41)
Albumin: 4.7 g/dL (ref 3.5–5.0)
Alkaline Phosphatase: 62 U/L (ref 38–126)
Anion gap: 13 (ref 5–15)
BUN: 11 mg/dL (ref 8–23)
CO2: 23 mmol/L (ref 22–32)
Calcium: 10.1 mg/dL (ref 8.9–10.3)
Chloride: 102 mmol/L (ref 98–111)
Creatinine: 1.1 mg/dL — ABNORMAL HIGH (ref 0.44–1.00)
GFR, Estimated: 53 mL/min — ABNORMAL LOW (ref >60)
Glucose, Bld: 192 mg/dL — ABNORMAL HIGH (ref 70–99)
Potassium: 3.5 mmol/L (ref 3.5–5.1)
Sodium: 137 mmol/L (ref 135–145)
Total Bilirubin: 0.2 mg/dL (ref 0.0–1.2)
Total Protein: 7.8 g/dL (ref 6.5–8.1)

## 2024-04-26 LAB — VITAMIN B12: Vitamin B-12: 319 pg/mL (ref 180–914)

## 2024-04-26 LAB — FERRITIN: Ferritin: 10 ng/mL — ABNORMAL LOW (ref 11–307)

## 2024-04-26 NOTE — Progress Notes (Signed)
 [facility] [phone]   [phone]  CONSULT NOTE  REFERRING PHYSICIAN: Dr. Beverley Corp  REASON FOR CONSULTATION:  73 years old female with iron deficiency anemia  HPI Margaret Long is a 73 y.o. female.   HPI  Discussed the use of AI scribe software for clinical note transcription with the patient, who gave verbal consent to proceed.  History of Present Illness Margaret Long is a 73 year old female with iron deficiency anemia who presents for evaluation of persistent anemia.  She has experienced iron deficiency anemia for the past two to three years. Despite being on iron supplements, initially one pill a day and later increased to two pills a day, her anemia has not improved. Recent tests show a hemoglobin of 9.4 g/dL and iron saturation at 7%.  She experiences extreme fatigue and shortness of breath during activities such as walking to the kitchen in the morning. She attributes some of her symptoms to inactivity since she stopped driving and going out regularly. She is currently participating in an exercise program through her insurance.  Her past medical history includes high blood pressure, acid reflux, high cholesterol, carpal tunnel syndrome, major depressive disorder, osteoporosis, vitamin B12 deficiency, vitamin D deficiency, and diabetes. She was previously on vitamin B12 supplements but was taken off them. No history of heart attacks or strokes.  Family history is significant for dementia in her mother and sister, and bladder cancer in her father. Two of her sisters passed away from cancer, though the specific type is unknown.  Socially, she is a widow with two surviving sons. She worked as a job network engineer. She quit smoking in 2005 after smoking for over 20 years and does not consume alcohol or use drugs. She is allergic to metoprolol , lisinopril, and Darvocet.     Past Medical History:  Diagnosis Date   Allergic  rhinitis 08/25/2015   Carpal tunnel syndrome 08/25/2015   Essential hypertension 06/10/2015   Gastroesophageal reflux disease without esophagitis 08/25/2015   Hardening of the aorta (main artery of the heart) 08/03/2021   Hypercholesterolemia 07/29/2014   Hyperlipemia 08/25/2015   Insomnia 07/30/2014   Major depressive disorder 07/30/2014   Osteopenia 08/25/2015   Osteoporosis 08/25/2015   Postablative hypothyroidism 07/29/2014   Trochanteric bursitis of right hip 06/19/2018   Type 2 diabetes mellitus without complication, without long-term current use of insulin  07/29/2014   Visual hallucinations 10/22/2022   Vitamin B12 deficiency 08/19/2017   Vitamin D deficiency 07/30/2014      No past surgical history on file.  Family History  Problem Relation Age of Onset   Dementia Mother        unspecified type   Dementia Sister        unspecified type    Social History Social History   Tobacco Use   Smoking status: Former    Current packs/day: 0.00    Types: Cigarettes    Quit date: 05/2003    Years since quitting: 20.9   Smokeless tobacco: Never  Substance Use Topics   Alcohol use: No   Drug use: No    Allergies  Allergen Reactions   Lisinopril Cough   Metoprolol  Other (See Comments)    Severe Hallucinations.    Other Other (See Comments)    Other reaction(s): Other (See Comments)  PAIN    Current Outpatient Medications  Medication Sig Dispense Refill   amLODipine  (NORVASC ) 5 MG tablet Take 1 tablet (5 mg total) by  mouth daily. 90 tablet 3   aspirin 81 MG EC tablet Take 1 tablet by mouth daily.     famotidine (PEPCID) 20 MG tablet Take by mouth.     ferrous sulfate 324 MG TBEC Take 324 mg by mouth.     ipratropium (ATROVENT ) 0.06 % nasal spray Place 1 spray into both nostrils 3 (three) times daily with meals as needed for rhinitis. 15 mL 12   levothyroxine (SYNTHROID) 75 MCG tablet Take 1 tablet by mouth daily.     losartan (COZAAR) 100 MG tablet 1 tablet  daily.     metFORMIN  (GLUCOPHAGE ) 1000 MG tablet 1 tablet 2 (two) times daily with a meal.     nitroGLYCERIN  (NITROSTAT ) 0.4 MG SL tablet Place 1 tablet (0.4 mg total) under the tongue every 5 (five) minutes as needed for chest pain. 25 tablet 3   omeprazole  (PRILOSEC) 20 MG capsule Take 1 capsule (20 mg total) by mouth 2 (two) times daily before a meal. 60 capsule 2   rosuvastatin  (CRESTOR ) 20 MG tablet TAKE 1 TABLET(20 MG) BY MOUTH DAILY 90 tablet 3   No current facility-administered medications for this visit.    Review of Systems  Constitutional: positive for fatigue Eyes: negative Ears, nose, mouth, throat, and face: negative Respiratory: positive for dyspnea on exertion Cardiovascular: negative Gastrointestinal: negative Genitourinary:negative Integument/breast: negative Hematologic/lymphatic: negative Musculoskeletal:negative Neurological: negative Behavioral/Psych: negative Endocrine: negative Allergic/Immunologic: negative  Physical Exam  MJO:jozmu, healthy, no distress, well nourished, and well developed SKIN: skin color, texture, turgor are normal, no rashes or significant lesions HEAD: Normocephalic, No masses, lesions, tenderness or abnormalities EYES: normal, PERRLA, Conjunctiva are pink and non-injected EARS: External ears normal, Canals clear OROPHARYNX:no exudate, no erythema, and lips, buccal mucosa, and tongue normal  NECK: supple, no adenopathy, no JVD LYMPH:  no palpable lymphadenopathy, no hepatosplenomegaly BREAST:not examined LUNGS: clear to auscultation , and palpation HEART: regular rate & rhythm, no murmurs, and no gallops ABDOMEN:abdomen soft, non-tender, normal bowel sounds, and no masses or organomegaly BACK: Back symmetric, no curvature., No CVA tenderness EXTREMITIES:no joint deformities, effusion, or inflammation, no edema  NEURO: alert & oriented x 3 with fluent speech, no focal motor/sensory deficits  PERFORMANCE STATUS: ECOG  1  LABORATORY DATA: Lab Results  Component Value Date   WBC 4.8 04/26/2024   HGB 9.4 (L) 04/26/2024   HCT 30.5 (L) 04/26/2024   MCV 85.7 04/26/2024   PLT 375 04/26/2024      Chemistry      Component Value Date/Time   NA 137 04/26/2024 1055   NA 138 01/01/2021 0830   K 3.5 04/26/2024 1055   CL 102 04/26/2024 1055   CO2 23 04/26/2024 1055   BUN 11 04/26/2024 1055   BUN 8 01/01/2021 0830   CREATININE 1.10 (H) 04/26/2024 1055      Component Value Date/Time   CALCIUM  10.1 04/26/2024 1055   ALKPHOS 62 04/26/2024 1055   AST 15 04/26/2024 1055   ALT <5 04/26/2024 1055   BILITOT 0.2 04/26/2024 1055       RADIOGRAPHIC STUDIES: No results found.  ASSESSMENT AND PLAN: This is a very pleasant 73 years old African-American female with persistent iron deficiency anemia of unclear etiology but likely could be secondary to gastrointestinal blood loss. Assessment and Plan Assessment & Plan Iron deficiency anemia Chronic iron deficiency anemia for 2-3 years with persistent low hemoglobin and iron saturation despite oral iron supplementation. Hemoglobin is 9.4, iron level is 29, and iron saturation is 7%. No  evidence of gastrointestinal bleeding from recent colonoscopy, but stool testing is pending. No dietary restrictions or vegetarian diet. No history of gastric or colon surgery. Symptoms include fatigue and exertional dyspnea. - Ordered stool cards to check for occult blood over three days. - Ordered additional labs including B12 and other relevant tests. - Will arrange for iron infusion at the [facility], three doses over three weeks. - Discussed potential side effects of iron infusion, including anaphylactic reaction and post-infusion fatigue. - Scheduled follow-up appointment in two months to reassess anemia and review lab results. She was advised to call immediately if she has any other concerning symptoms in the interval.  The patient voices understanding of  current disease status and treatment options and is in agreement with the current care plan.  All questions were answered. The patient knows to call the clinic with any problems, questions or concerns. We can certainly see the patient much sooner if necessary.  Thank you so much for allowing me to participate in the care of [facility]. I will continue to follow up the patient with you and assist in her care.  The total time spent in the appointment was 60 minutes including review of chart and various tests results, discussions about plan of care and coordination of care plan .   Disclaimer: This note was dictated with voice recognition software. Similar sounding words can inadvertently be transcribed and may not be corrected upon review.   Margaret Long April 26, 2024, 12:04 PM

## 2024-04-27 ENCOUNTER — Telehealth (HOSPITAL_COMMUNITY): Payer: Self-pay | Admitting: Pharmacy Technician

## 2024-04-27 NOTE — Telephone Encounter (Signed)
 Auth Submission: NO AUTH NEEDED Site of care: [facility] Payer: [employer] Medication & CPT/J Code(s) submitted: Venofer (Iron Sucrose) J1756 Diagnosis Code: D50.8 Route of submission (phone, fax, portal):  Phone # Fax # Auth type: Buy/Bill HB Units/visits requested: 300MG  X 3 DOSES Reference number:  Approval from: 04/27/2024 to 07/21/24    Dagoberto Armour, CPhT [facility] Phone: [phone] 04/27/2024

## 2024-04-29 LAB — PROTEIN ELECTROPHORESIS, SERUM
A/G Ratio: 1 (ref 0.7–1.7)
Albumin ELP: 3.7 g/dL (ref 2.9–4.4)
Alpha-1-Globulin: 0.2 g/dL (ref 0.0–0.4)
Alpha-2-Globulin: 1.1 g/dL — ABNORMAL HIGH (ref 0.4–1.0)
Beta Globulin: 1.1 g/dL (ref 0.7–1.3)
Gamma Globulin: 1.2 g/dL (ref 0.4–1.8)
Globulin, Total: 3.6 g/dL (ref 2.2–3.9)
Total Protein ELP: 7.3 g/dL (ref 6.0–8.5)

## 2024-05-02 ENCOUNTER — Telehealth (INDEPENDENT_AMBULATORY_CARE_PROVIDER_SITE_OTHER): Payer: Self-pay

## 2024-05-02 NOTE — Telephone Encounter (Signed)
 Patient had called in and left voice message  asking how to stop Omeprazole . I called patient back after Dr. Anice addressed. Per Anice how to stop it, Once a day for 1 week, then every other day for one week. Then every third day for a week.

## 2024-05-02 NOTE — Telephone Encounter (Signed)
 error

## 2024-05-02 NOTE — Telephone Encounter (Signed)
 Patient left voice mail today at 1:19 pm. She is asking how to stop taking Omeprazole . She does not want to take it any longer. Dr. Anice is not in office so I am sending him a message.

## 2024-05-04 ENCOUNTER — Inpatient Hospital Stay (HOSPITAL_COMMUNITY): Admission: RE | Admit: 2024-05-04 | Discharge: 2024-05-04 | Attending: Internal Medicine

## 2024-05-04 ENCOUNTER — Telehealth: Payer: Self-pay | Admitting: *Deleted

## 2024-05-04 ENCOUNTER — Other Ambulatory Visit: Payer: Self-pay

## 2024-05-04 VITALS — BP 145/90 | HR 88 | Temp 98.0°F | Resp 16

## 2024-05-04 DIAGNOSIS — D508 Other iron deficiency anemias: Secondary | ICD-10-CM | POA: Insufficient documentation

## 2024-05-04 MED ORDER — IRON SUCROSE 300 MG IVPB - SIMPLE MED
300.0000 mg | Status: DC
  Administered 2024-05-04: 300 mg via INTRAVENOUS
  Filled 2024-05-04: qty 300

## 2024-05-04 NOTE — Telephone Encounter (Signed)
 Patient called on yesterday evening stating she completed the hemoccult kit.  Wanted to know if it was ok to turn the completed kit to the lab at [facility] today when she comes for her Infusion appointment at [facility], instead of mailing them.  Return call to patient to inform her it was ok to take her hemoccult kit to the [facility] lab per John/CMA  She verbalized understanding.

## 2024-05-11 ENCOUNTER — Encounter (HOSPITAL_COMMUNITY)

## 2024-05-11 VITALS — BP 145/83 | HR 86 | Temp 98.0°F | Resp 16

## 2024-05-11 DIAGNOSIS — D508 Other iron deficiency anemias: Secondary | ICD-10-CM

## 2024-05-11 MED ORDER — IRON SUCROSE 300 MG IVPB - SIMPLE MED
300.0000 mg | Status: DC
  Administered 2024-05-11: 300 mg via INTRAVENOUS
  Filled 2024-05-11: qty 300

## 2024-05-18 ENCOUNTER — Encounter (HOSPITAL_COMMUNITY)
Admission: RE | Admit: 2024-05-18 | Discharge: 2024-05-18 | Disposition: A | Discharge status: Home / Self Care | Source: Ambulatory Visit | Attending: Internal Medicine | Admitting: Internal Medicine

## 2024-05-18 VITALS — BP 139/74 | HR 86 | Temp 98.5°F | Resp 16

## 2024-05-18 DIAGNOSIS — D508 Other iron deficiency anemias: Secondary | ICD-10-CM | POA: Diagnosis not present

## 2024-05-18 MED ORDER — IRON SUCROSE 300 MG IVPB - SIMPLE MED
300.0000 mg | Status: DC
  Administered 2024-05-18: 300 mg via INTRAVENOUS
  Filled 2024-05-18: qty 300

## 2024-06-09 ENCOUNTER — Other Ambulatory Visit (INDEPENDENT_AMBULATORY_CARE_PROVIDER_SITE_OTHER): Payer: Self-pay

## 2024-06-28 ENCOUNTER — Inpatient Hospital Stay: Admitting: Internal Medicine

## 2024-06-28 ENCOUNTER — Inpatient Hospital Stay: Attending: Internal Medicine

## 2024-06-28 DIAGNOSIS — D508 Other iron deficiency anemias: Secondary | ICD-10-CM

## 2024-06-28 LAB — CBC WITH DIFFERENTIAL (CANCER CENTER ONLY)
Abs Immature Granulocytes: 0.02 10*3/uL (ref 0.00–0.07)
Basophils Absolute: 0 10*3/uL (ref 0.0–0.1)
Basophils Relative: 0 %
Eosinophils Absolute: 0 10*3/uL (ref 0.0–0.5)
Eosinophils Relative: 0 %
HCT: 34.2 % — ABNORMAL LOW (ref 36.0–46.0)
Hemoglobin: 11.1 g/dL — ABNORMAL LOW (ref 12.0–15.0)
Immature Granulocytes: 0 %
Lymphocytes Relative: 26 %
Lymphs Abs: 1.2 10*3/uL (ref 0.7–4.0)
MCH: 27.8 pg (ref 26.0–34.0)
MCHC: 32.5 g/dL (ref 30.0–36.0)
MCV: 85.5 fL (ref 80.0–100.0)
Monocytes Absolute: 0.4 10*3/uL (ref 0.1–1.0)
Monocytes Relative: 8 %
Neutro Abs: 3.1 10*3/uL (ref 1.7–7.7)
Neutrophils Relative %: 66 %
Platelet Count: 312 10*3/uL (ref 150–400)
RBC: 4 MIL/uL (ref 3.87–5.11)
RDW: 16 % — ABNORMAL HIGH (ref 11.5–15.5)
WBC Count: 4.7 10*3/uL (ref 4.0–10.5)
nRBC: 0 % (ref 0.0–0.2)

## 2024-06-28 LAB — IRON AND IRON BINDING CAPACITY (CC-WL,HP ONLY)
Iron: 53 ug/dL (ref 28–170)
Saturation Ratios: 16 % (ref 10.4–31.8)
TIBC: 330 ug/dL (ref 250–450)
UIBC: 278 ug/dL

## 2024-06-28 LAB — FERRITIN: Ferritin: 103 ng/mL (ref 11–307)

## 2024-06-28 NOTE — Progress Notes (Signed)
 "     [facility] [phone]   [phone]  OFFICE PROGRESS NOTE  Kip Righter, MD [address] [place] [address]  DIAGNOSIS: persistent iron  deficiency anemia of unclear etiology but likely could be secondary to gastrointestinal blood loss.   PRIOR THERAPY: Iron  infusion with Venofer  300 mg IV weekly.  CURRENT THERAPY:  INTERVAL HISTORY: Margaret Long 74 y.o. female returns to the clinic today for follow-up visit. Discussed the use of AI scribe software for clinical note transcription with the patient, who gave verbal consent to proceed.  History of Present Illness Margaret Long is a 74 year old female with iron  deficiency anemia attributed to anastrozole therapy who presents for hematology follow-up after recent Venofer  infusions.  She has persistent iron  deficiency anemia, previously attributed to anastrozole use and possible gastrointestinal blood loss. She recently completed three Venofer  infusions, resulting in marked improvement in energy levels, with resolution of fatigue and dizziness. She continues to experience occasional exertional dyspnea and has reduced her exercise tolerance. Laboratory studies demonstrate improvement in hemoglobin from 9.4 to 11.1 g/dL, as well as increased serum iron  and iron  saturation. Ferritin, zinc, and copper levels are pending. She continues daily oral iron  supplementation but missed several doses recently due to supply delay, with plans to resume as soon as possible.  She has chronic constipation, which has complicated completion of stool card testing for occult gastrointestinal blood loss. She uses Miralax as needed and experiences frequent bloating, but denies other acute gastrointestinal symptoms. Her last colonoscopy was in 2023. She was unable to submit her previous stool card due to logistical issues and was provided new cards with instructions to complete and return them.          MEDICAL HISTORY: Past Medical History:  Diagnosis Date   Allergic rhinitis 08/25/2015   Carpal tunnel syndrome 08/25/2015   Essential hypertension 06/10/2015   Gastroesophageal reflux disease without esophagitis 08/25/2015   Hardening of the aorta (main artery of the heart) 08/03/2021   Hypercholesterolemia 07/29/2014   Hyperlipemia 08/25/2015   Insomnia 07/30/2014   Major depressive disorder 07/30/2014   Osteopenia 08/25/2015   Osteoporosis 08/25/2015   Postablative hypothyroidism 07/29/2014   Trochanteric bursitis of right hip 06/19/2018   Type 2 diabetes mellitus without complication, without long-term current use of insulin  07/29/2014   Visual hallucinations 10/22/2022   Vitamin B12 deficiency 08/19/2017   Vitamin D deficiency 07/30/2014    ALLERGIES:  is allergic to lisinopril, metoprolol , and other.  MEDICATIONS:  Current Outpatient Medications  Medication Sig Dispense Refill   amLODipine  (NORVASC ) 5 MG tablet Take 1 tablet (5 mg total) by mouth daily. 90 tablet 3   aspirin 81 MG EC tablet Take 1 tablet by mouth daily.     famotidine (PEPCID) 20 MG tablet Take by mouth.     ferrous sulfate 324 MG TBEC Take 324 mg by mouth.     ipratropium (ATROVENT ) 0.06 % nasal spray Place 1 spray into both nostrils 3 (three) times daily with meals as needed for rhinitis. 15 mL 12   levothyroxine (SYNTHROID) 75 MCG tablet Take 1 tablet by mouth daily.     losartan (COZAAR) 100 MG tablet 1 tablet daily.     metFORMIN  (GLUCOPHAGE ) 1000 MG tablet 1 tablet 2 (two) times daily with a meal.     nitroGLYCERIN  (NITROSTAT ) 0.4 MG SL tablet Place 1 tablet (0.4 mg total) under the tongue every 5 (five) minutes as needed for chest pain. 25 tablet 3  omeprazole  (PRILOSEC) 20 MG capsule TAKE 1 CAPSULE(20 MG) BY MOUTH TWICE DAILY BEFORE A MEAL 60 capsule 2   rosuvastatin  (CRESTOR ) 20 MG tablet TAKE 1 TABLET(20 MG) BY MOUTH DAILY 90 tablet 3   No current facility-administered medications for this  visit.    SURGICAL HISTORY: No past surgical history on file.  REVIEW OF SYSTEMS:  A comprehensive review of systems was negative except for: Gastrointestinal: positive for constipation   PHYSICAL EXAMINATION: General appearance: alert, cooperative, and no distress Head: Normocephalic, without obvious abnormality, atraumatic Neck: no adenopathy, no JVD, supple, symmetrical, trachea midline, and thyroid  not enlarged, symmetric, no tenderness/mass/nodules Lymph nodes: Cervical, supraclavicular, and axillary nodes normal. Resp: clear to auscultation bilaterally Back: symmetric, no curvature. ROM normal. No CVA tenderness. Cardio: regular rate and rhythm, S1, S2 normal, no murmur, click, rub or gallop GI: soft, non-tender; bowel sounds normal; no masses,  no organomegaly Extremities: extremities normal, atraumatic, no cyanosis or edema  ECOG PERFORMANCE STATUS: 0 - Asymptomatic  There were no vitals taken for this visit.  LABORATORY DATA: Lab Results  Component Value Date   WBC 4.7 06/28/2024   HGB 11.1 (L) 06/28/2024   HCT 34.2 (L) 06/28/2024   MCV 85.5 06/28/2024   PLT 312 06/28/2024      Chemistry      Component Value Date/Time   NA 137 04/26/2024 1055   NA 138 01/01/2021 0830   K 3.5 04/26/2024 1055   CL 102 04/26/2024 1055   CO2 23 04/26/2024 1055   BUN 11 04/26/2024 1055   BUN 8 01/01/2021 0830   CREATININE 1.10 (H) 04/26/2024 1055      Component Value Date/Time   CALCIUM  10.1 04/26/2024 1055   ALKPHOS 62 04/26/2024 1055   AST 15 04/26/2024 1055   ALT <5 04/26/2024 1055   BILITOT 0.2 04/26/2024 1055       RADIOGRAPHIC STUDIES: No results found.  ASSESSMENT AND PLAN: This is a very pleasant 74 years old African-American female with iron  deficiency anemia of unclear etiology suspicious for gastrointestinal blood loss.  She was treated with iron  infusion with Venofer . Assessment and Plan Assessment & Plan Iron  deficiency anemia Chronic iron  deficiency  anemia, with suspicion for gastrointestinal blood loss. She demonstrated significant improvement in hemoglobin, serum iron , and iron  saturation following three Venofer  infusions. Currently asymptomatic regarding fatigue and dizziness, with only occasional exertional dyspnea. Ongoing evaluation for occult gastrointestinal blood loss remains indicated due to persistent risk factors. - Reviewed recent blood work showing improvement in hemoglobin, serum iron , and iron  saturation. - Instructed her to continue daily oral iron  supplementation. - Provided stool cards for fecal occult blood testing and instructed completion and return to clinic laboratory. - Scheduled follow-up in three months. - Advised she will be contacted if concerning findings arise from pending laboratory results.  Constipation Chronic constipation impacting ability to complete stool card testing. Miralax does not interfere with fecal occult blood testing. - Advised that Miralax will not affect stool card results. - Instructed her to complete stool cards on three different days and return them to the clinic laboratory. The patient was advised to call immediately if she has any other concerning symptoms in the interval. The patient voices understanding of current disease status and treatment options and is in agreement with the current care plan.  All questions were answered. The patient knows to call the clinic with any problems, questions or concerns. We can certainly see the patient much sooner if necessary. The total time spent in  the appointment was 20 minutes including review of chart and various tests results, discussions about plan of care and coordination of care plan .   Disclaimer: This note was dictated with voice recognition software. Similar sounding words can inadvertently be transcribed and may not be corrected upon review.        "

## 2024-06-29 LAB — CERULOPLASMIN: Ceruloplasmin: 23.9 mg/dL (ref 19.0–39.0)

## 2024-09-25 ENCOUNTER — Inpatient Hospital Stay: Attending: Internal Medicine

## 2024-09-25 ENCOUNTER — Inpatient Hospital Stay: Admitting: Internal Medicine

## 2024-10-23 ENCOUNTER — Ambulatory Visit: Admitting: Cardiology
